# Patient Record
Sex: Female | Born: 1985 | Race: Black or African American | Hispanic: No | Marital: Single | Smoking: Never smoker
Health system: Southern US, Community
[De-identification: ages and names within clinical notes are randomized; demographics above are authoritative.]

## PROBLEM LIST (undated history)

## (undated) ENCOUNTER — Inpatient Hospital Stay (HOSPITAL_COMMUNITY): Payer: Self-pay

## (undated) DIAGNOSIS — IMO0002 Reserved for concepts with insufficient information to code with codable children: Secondary | ICD-10-CM

## (undated) DIAGNOSIS — T4145XA Adverse effect of unspecified anesthetic, initial encounter: Secondary | ICD-10-CM

## (undated) DIAGNOSIS — D649 Anemia, unspecified: Secondary | ICD-10-CM

## (undated) DIAGNOSIS — L0291 Cutaneous abscess, unspecified: Secondary | ICD-10-CM

## (undated) DIAGNOSIS — B999 Unspecified infectious disease: Secondary | ICD-10-CM

## (undated) HISTORY — DX: Anemia, unspecified: D64.9

## (undated) HISTORY — DX: Adverse effect of unspecified anesthetic, initial encounter: T41.45XA

## (undated) HISTORY — DX: Unspecified infectious disease: B99.9

## (undated) HISTORY — DX: Reserved for concepts with insufficient information to code with codable children: IMO0002

## (undated) SURGERY — Surgical Case
Anesthesia: *Unknown

---
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
## 2007-01-21 ENCOUNTER — Ambulatory Visit: Payer: Self-pay | Admitting: Family Medicine

## 2007-01-28 ENCOUNTER — Encounter (INDEPENDENT_AMBULATORY_CARE_PROVIDER_SITE_OTHER): Payer: Self-pay | Admitting: *Deleted

## 2007-01-28 ENCOUNTER — Ambulatory Visit: Payer: Self-pay | Admitting: Family Medicine

## 2007-01-28 LAB — CONVERTED CEMR LAB
Glucose, Urine, Semiquant: NEGATIVE
Protein, U semiquant: NEGATIVE

## 2007-02-03 ENCOUNTER — Ambulatory Visit: Payer: Self-pay | Admitting: Family Medicine

## 2007-02-03 ENCOUNTER — Encounter (INDEPENDENT_AMBULATORY_CARE_PROVIDER_SITE_OTHER): Payer: Self-pay | Admitting: *Deleted

## 2007-02-11 ENCOUNTER — Ambulatory Visit: Payer: Self-pay | Admitting: Family Medicine

## 2007-02-11 LAB — CONVERTED CEMR LAB: Glucose, Urine, Semiquant: NEGATIVE

## 2007-03-04 ENCOUNTER — Ambulatory Visit (HOSPITAL_COMMUNITY): Admission: RE | Admit: 2007-03-04 | Discharge: 2007-03-04 | Payer: Self-pay | Admitting: *Deleted

## 2007-03-04 ENCOUNTER — Encounter (INDEPENDENT_AMBULATORY_CARE_PROVIDER_SITE_OTHER): Payer: Self-pay | Admitting: *Deleted

## 2007-04-03 DIAGNOSIS — T8859XA Other complications of anesthesia, initial encounter: Secondary | ICD-10-CM

## 2007-04-03 DIAGNOSIS — R87619 Unspecified abnormal cytological findings in specimens from cervix uteri: Secondary | ICD-10-CM

## 2007-04-03 DIAGNOSIS — IMO0002 Reserved for concepts with insufficient information to code with codable children: Secondary | ICD-10-CM

## 2007-04-03 HISTORY — DX: Reserved for concepts with insufficient information to code with codable children: IMO0002

## 2007-04-03 HISTORY — DX: Unspecified abnormal cytological findings in specimens from cervix uteri: R87.619

## 2007-04-03 HISTORY — DX: Other complications of anesthesia, initial encounter: T88.59XA

## 2007-04-16 ENCOUNTER — Inpatient Hospital Stay (HOSPITAL_COMMUNITY): Admission: AD | Admit: 2007-04-16 | Discharge: 2007-04-16 | Payer: Self-pay | Admitting: Obstetrics and Gynecology

## 2007-04-20 ENCOUNTER — Inpatient Hospital Stay (HOSPITAL_COMMUNITY): Admission: AD | Admit: 2007-04-20 | Discharge: 2007-04-20 | Payer: Self-pay | Admitting: Obstetrics and Gynecology

## 2007-05-06 ENCOUNTER — Inpatient Hospital Stay (HOSPITAL_COMMUNITY): Admission: AD | Admit: 2007-05-06 | Discharge: 2007-05-06 | Payer: Self-pay | Admitting: Obstetrics and Gynecology

## 2007-05-24 ENCOUNTER — Inpatient Hospital Stay (HOSPITAL_COMMUNITY): Admission: AD | Admit: 2007-05-24 | Discharge: 2007-05-25 | Payer: Self-pay | Admitting: Obstetrics and Gynecology

## 2007-06-05 ENCOUNTER — Inpatient Hospital Stay (HOSPITAL_COMMUNITY): Admission: AD | Admit: 2007-06-05 | Discharge: 2007-06-05 | Payer: Self-pay | Admitting: Obstetrics and Gynecology

## 2007-06-21 ENCOUNTER — Inpatient Hospital Stay (HOSPITAL_COMMUNITY): Admission: AD | Admit: 2007-06-21 | Discharge: 2007-06-21 | Payer: Self-pay | Admitting: Obstetrics and Gynecology

## 2007-06-25 ENCOUNTER — Inpatient Hospital Stay (HOSPITAL_COMMUNITY): Admission: AD | Admit: 2007-06-25 | Discharge: 2007-06-25 | Payer: Self-pay | Admitting: Obstetrics and Gynecology

## 2007-06-26 ENCOUNTER — Inpatient Hospital Stay (HOSPITAL_COMMUNITY): Admission: AD | Admit: 2007-06-26 | Discharge: 2007-06-26 | Payer: Self-pay | Admitting: Obstetrics and Gynecology

## 2007-07-03 ENCOUNTER — Inpatient Hospital Stay (HOSPITAL_COMMUNITY): Admission: AD | Admit: 2007-07-03 | Discharge: 2007-07-04 | Payer: Self-pay | Admitting: Obstetrics and Gynecology

## 2007-07-15 ENCOUNTER — Inpatient Hospital Stay (HOSPITAL_COMMUNITY): Admission: AD | Admit: 2007-07-15 | Discharge: 2007-07-18 | Payer: Self-pay | Admitting: Obstetrics and Gynecology

## 2008-01-10 ENCOUNTER — Emergency Department (HOSPITAL_COMMUNITY): Admission: EM | Admit: 2008-01-10 | Discharge: 2008-01-11 | Payer: Self-pay | Admitting: Emergency Medicine

## 2008-12-08 ENCOUNTER — Encounter: Payer: Self-pay | Admitting: Family Medicine

## 2008-12-08 ENCOUNTER — Ambulatory Visit: Payer: Self-pay | Admitting: Family Medicine

## 2008-12-08 DIAGNOSIS — N898 Other specified noninflammatory disorders of vagina: Secondary | ICD-10-CM | POA: Insufficient documentation

## 2008-12-08 DIAGNOSIS — D649 Anemia, unspecified: Secondary | ICD-10-CM | POA: Insufficient documentation

## 2008-12-08 DIAGNOSIS — N912 Amenorrhea, unspecified: Secondary | ICD-10-CM | POA: Insufficient documentation

## 2008-12-08 LAB — CONVERTED CEMR LAB
ALT: 12 units/L (ref 0–35)
AST: 13 units/L (ref 0–37)
Alkaline Phosphatase: 69 units/L (ref 39–117)
Beta hcg, urine, semiquantitative: NEGATIVE
Calcium: 8.9 mg/dL (ref 8.4–10.5)
Chlamydia, DNA Probe: NEGATIVE
Chloride: 108 meq/L (ref 96–112)
Creatinine, Ser: 0.94 mg/dL (ref 0.40–1.20)
GC Probe Amp, Genital: NEGATIVE
Total Protein: 7.3 g/dL (ref 6.0–8.3)

## 2008-12-15 ENCOUNTER — Telehealth (INDEPENDENT_AMBULATORY_CARE_PROVIDER_SITE_OTHER): Payer: Self-pay | Admitting: *Deleted

## 2008-12-21 ENCOUNTER — Ambulatory Visit: Payer: Self-pay | Admitting: Family Medicine

## 2008-12-21 DIAGNOSIS — R87613 High grade squamous intraepithelial lesion on cytologic smear of cervix (HGSIL): Secondary | ICD-10-CM

## 2008-12-22 ENCOUNTER — Encounter: Payer: Self-pay | Admitting: Family Medicine

## 2009-01-02 ENCOUNTER — Telehealth (INDEPENDENT_AMBULATORY_CARE_PROVIDER_SITE_OTHER): Payer: Self-pay | Admitting: *Deleted

## 2009-06-24 ENCOUNTER — Encounter: Payer: Self-pay | Admitting: Family Medicine

## 2010-05-02 NOTE — Letter (Signed)
Summary: Unable to contact, needs follow-up  [facility]  [address]   [place], [place] [address]   Phone: [phone]  Fax: [phone]    06/24/2009  Irish Donaghy [address] [place], [place]  [address]  Dear Ms. Kirk,  You did not keep your appointment for gynecology referral that we made for you.  I am concerned as your colposcopy taht her had performed in our office needs prompt follow-up.  I have attempted to call you and have not been able to get a correct phone number.  Please call our office to update your contact information.  We need to reschedule you for further evaluation promptly.   Sincerely,   Delbert Harness MD  Appended Document: Unable to contact, needs follow-up mailed certified.  Appended Document: Unable to contact, needs follow-up Please send the letter from [facility] as a certifed letter to her most current addres thanks LC  Appended Document: Unable to contact, needs follow-up cert mail returned- unclaimed

## 2010-07-31 ENCOUNTER — Encounter: Payer: Self-pay | Admitting: Family Medicine

## 2010-08-15 NOTE — Op Note (Signed)
NAME:  Melanie Neal, Melanie Neal             ACCOUNT NO.:  [id number]   MEDICAL RECORD NO.:  [id number]          PATIENT TYPE:  INP   LOCATION:  9169                          FACILITY:  [facility]   PHYSICIAN:  Charles A. Delcambre, MDDATE OF BIRTH:  1985-12-05   DATE OF PROCEDURE:  07/15/2007  DATE OF DISCHARGE:                               OPERATIVE REPORT   PREOPERATIVE DIAGNOSIS:  1. Intrauterine pregnancy at 37 weeks and 3 days.  2. Fetal bradycardia.  3. Resultant fetal intolerance of labor.   POSTOPERATIVE DIAGNOSIS:  1. Intrauterine pregnancy at 37 weeks and 3 days.  2. Fetal bradycardia.  3. Resultant fetal intolerance of labor.   PROCEDURE:  Primary low transverse cesarean section.   SURGEON:  Charles A. Delcambre, MD   ASSISTANT:  None.   COMPLICATIONS:  None.   ESTIMATED BLOOD LOSS:  700 mL.  Urine output 275 mL, clear.   INSTRUMENT SPONGE NEEDLE COUNT:  Correct x2.   PATHOLOGY:  Placenta to pathology.   FINDINGS:  Vigorous female, Apgars eight and nine, six pounds 15 ounces.   DESCRIPTION OF PROCEDURE:  The patient was taken to the operating room,  placed supine position, dosing of the epidural was done.  She was  adequately dosed and sterile prep and drape was undertaken.  Pfannenstiel incision was made with a knife, carried down to fascia.  The fascia was incised with a knife and Mayo scissors.  Sharp dissection  was used to release the rectus sheath superiorly and inferiorly.  The  rectus muscles were sharply dissected in the midline.  Peritoneum was  entered with a hemostat.  Traction was used to extend this incision.  Alexis retractor was then placed, hand was swept and there was no bowel  or omentum caught under the retractor.  This retractor was then rolled  down for adequate exposure.  The vesicouterine peritoneum was incised  with Metzenbaum scissors and blunt dissection was used to develop the  bladder flap.  Lower uterine segment transverse incision to  amniotomy  was noted with fetal head ballotable.  Entry into the amniotic cavity  was done without injury to the infant.  Traction was placed.  The  uterine incision was extended by cephalad and caudal traction.  Hand was  inserted.  Occiput was lifted to the uterine and guided to the uterine  incision site.  Fundal pressure by the operator's assistant was used to  deliver the vertex. Bulb suction was done.  The remainder of the infant  was delivered with further fundal pressure without difficulty.  Cord was  clamped and infant was shown to the parents and handed off to the  neonatologist in attendance.  Placenta was made manually expressed after  cord blood was taken for routine testing.  The placenta was sent to  labor and delivery. Internal surface of the uterus was wiped with a dry  lap and uterine incision was then closed in two layers with #1 chromic  running locking, first layer running nonlocking second layer.  Irrigation was carried out.  Hemostasis was excellent.  Fascia was  closed with #1 Vicryl running nonlocking  suture.  Irrigation was carried  out.  Subcutaneous layer minor electrocautery achieved good hemostasis.  Sterile skin clips were used to close the skin.  Sterile dressing was  applied.  The patient was taken to recovery with physician in attendance  having tolerated the procedure well.      Charles A. Sydnee Cabal, MD  Electronically Signed     CAD/MEDQ  D:  07/15/2007  T:  07/15/2007  Job:  [id number]

## 2010-08-18 NOTE — Discharge Summary (Signed)
NAME:  Melanie Neal, Melanie Neal             ACCOUNT NO.:  [id number]   MEDICAL RECORD NO.:  [id number]          PATIENT TYPE:  INP   LOCATION:  9101                          FACILITY:  [facility]   PHYSICIAN:  Charles A. Delcambre, MDDATE OF BIRTH:  04-May-1985   DATE OF ADMISSION:  07/15/2007  DATE OF DISCHARGE:  07/18/2007                               DISCHARGE SUMMARY   PRIMARY DISCHARGE DIAGNOSES:  1. Intrauterine pregnancy 37 weeks and 3 days.  2. Fetal intolerance with labor.   PROCEDURE:  Primary low transverse cesarean section.   OPERATIVE FINDINGS:  Normal tubes and ovaries, vigorous female, Apgars 8  and 9, 3170 g, and 49.5 cm in length.   LABORATORY:  Postoperative hematocrit 30.5 and hemoglobin 10.4.   HISTORY AND PHYSICAL:  As written in the chart.   HOSPITAL COURSE:  The patient was noted to cervix from 1 cm to 4 cm and  was admitted therefore active labor.  She progressed on to 580 -3 and  began having intolerance fetal heart rate, nonreassuring.  She was  therefore taken for cesarean section.  The cesarean section was  undertaken, done without difficulty or complications.  Live born infant  delivered as noted above.  On postop day #1, she had no trouble voiding.  Pain was controlled with p.o. medications after Duramorph.  She was  stable and tolerating a regular diet and was therefore discharged home  on postop day #3.  Depo-Provera 150 mg IM was given prior to discharge.      Charles A. Sydnee Cabal, MD  Electronically Signed     CAD/MEDQ  D:  08/05/2007  T:  08/06/2007  Job:  [id number]

## 2010-12-12 ENCOUNTER — Ambulatory Visit: Payer: Self-pay | Admitting: Family Medicine

## 2010-12-18 ENCOUNTER — Ambulatory Visit (INDEPENDENT_AMBULATORY_CARE_PROVIDER_SITE_OTHER): Payer: Self-pay | Admitting: Family Medicine

## 2010-12-18 ENCOUNTER — Encounter: Payer: Self-pay | Admitting: Family Medicine

## 2010-12-18 VITALS — BP 118/81 | HR 76 | Wt 233.0 lb

## 2010-12-18 DIAGNOSIS — R87613 High grade squamous intraepithelial lesion on cytologic smear of cervix (HGSIL): Secondary | ICD-10-CM

## 2010-12-18 DIAGNOSIS — N926 Irregular menstruation, unspecified: Secondary | ICD-10-CM

## 2010-12-18 LAB — POCT URINE PREGNANCY: Preg Test, Ur: NEGATIVE

## 2010-12-18 NOTE — Patient Instructions (Signed)
You had pre-cancerous cells on your cervix in 2010.  It is VERY important to get evaluated by a gynecologist.  Let us know if you don't hear about an appt in the next week Very important to take prenatal vitamins daily i recommend not getting pregnant- use condoms- until you have your appointment with gynecology. Work on healthy foods and daily exercise to help get you to the healthiest weight for you.  At your age , it can take 6 months to a year of trying before getting pregnant and this is normal.

## 2010-12-19 NOTE — Assessment & Plan Note (Addendum)
Patient declines pap today, states she is in a hurry and will reschedule.  I asked her to make follow-up for pap on her way out.  Discussed case with Dr Debroah Loop, OBGYN, given her young age at diagnosis, he would recommend a pap now, and repeat in 6 months.  If normal, can resume annual screening.  If abnormal, will send for colposcopy.  I advised against pregnancy until this is resolved.

## 2010-12-19 NOTE — Progress Notes (Signed)
  Subjective:    Patient ID: Melanie Neal, female    DOB: 1985-05-21, 25 y.o.   MRN: [id number]  HPI Here for a pregnancy test.  Patient states she has been trying to get pregnant with her boyfriend for 1-2 months.  She last had a lighter than usual period 2 days ago.  Otherwise has had normal monthly periods.  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 She states she moved addresses and changed phone numbers and did not follow-up her test results.   Is taking PNV.  I spent >25 minutes with the patient, over 50% of which was spent in counseling and/or coordination of care as noted.   Review of Systemssee HPI     Objective:   Physical Exam GEN: NAD, does not appear distressed with results of possible cervical cancer.       Assessment & Plan:

## 2010-12-21 LAB — FETAL FIBRONECTIN: Fetal Fibronectin: NEGATIVE

## 2010-12-21 LAB — URINALYSIS, ROUTINE W REFLEX MICROSCOPIC
Bilirubin Urine: NEGATIVE
Bilirubin Urine: NEGATIVE
Glucose, UA: NEGATIVE
Hgb urine dipstick: NEGATIVE
Ketones, ur: NEGATIVE
Ketones, ur: NEGATIVE
Nitrite: NEGATIVE
Specific Gravity, Urine: 1.01
Urobilinogen, UA: 0.2
pH: 6.5

## 2010-12-21 LAB — URINE MICROSCOPIC-ADD ON

## 2010-12-22 LAB — URINALYSIS, ROUTINE W REFLEX MICROSCOPIC
Glucose, UA: NEGATIVE
Hgb urine dipstick: NEGATIVE
Urobilinogen, UA: 1
pH: 7

## 2010-12-25 LAB — URINALYSIS, ROUTINE W REFLEX MICROSCOPIC
Bilirubin Urine: NEGATIVE
Hgb urine dipstick: NEGATIVE
Ketones, ur: NEGATIVE
Specific Gravity, Urine: 1.025
pH: 5.5

## 2010-12-26 LAB — CBC
HCT: 33.1 — ABNORMAL LOW
HCT: 30.5 — ABNORMAL LOW
Hemoglobin: 10.4 — ABNORMAL LOW
MCHC: 34.2
MCV: 83.3
RBC: 3.98
RDW: 14.7
RDW: 14.4
WBC: 13.3 — ABNORMAL HIGH

## 2010-12-26 LAB — URINALYSIS, ROUTINE W REFLEX MICROSCOPIC
Bilirubin Urine: NEGATIVE
Glucose, UA: NEGATIVE
Ketones, ur: 15 — AB
Nitrite: NEGATIVE
Protein, ur: NEGATIVE

## 2010-12-26 LAB — URINE MICROSCOPIC-ADD ON: RBC / HPF: NONE SEEN

## 2011-01-25 ENCOUNTER — Encounter: Payer: Self-pay | Admitting: Family Medicine

## 2011-02-12 ENCOUNTER — Encounter: Payer: Self-pay | Admitting: Family Medicine

## 2011-09-24 ENCOUNTER — Inpatient Hospital Stay (HOSPITAL_COMMUNITY): Payer: Medicaid Other

## 2011-09-24 ENCOUNTER — Encounter (HOSPITAL_COMMUNITY): Payer: Self-pay | Admitting: *Deleted

## 2011-09-24 ENCOUNTER — Inpatient Hospital Stay (HOSPITAL_COMMUNITY)
Admission: AD | Admit: 2011-09-24 | Discharge: 2011-09-24 | Disposition: A | Discharge status: Home / Self Care | Payer: Medicaid Other | Source: Ambulatory Visit | Attending: Obstetrics and Gynecology | Admitting: Obstetrics and Gynecology

## 2011-09-24 DIAGNOSIS — Z363 Encounter for antenatal screening for malformations: Secondary | ICD-10-CM

## 2011-09-24 DIAGNOSIS — R109 Unspecified abdominal pain: Secondary | ICD-10-CM | POA: Insufficient documentation

## 2011-09-24 DIAGNOSIS — O99891 Other specified diseases and conditions complicating pregnancy: Secondary | ICD-10-CM | POA: Insufficient documentation

## 2011-09-24 DIAGNOSIS — Z1389 Encounter for screening for other disorder: Secondary | ICD-10-CM

## 2011-09-24 DIAGNOSIS — Z3490 Encounter for supervision of normal pregnancy, unspecified, unspecified trimester: Secondary | ICD-10-CM

## 2011-09-24 LAB — CBC
MCH: 28.1 pg (ref 26.0–34.0)
Platelets: 249 10*3/uL (ref 150–400)
RBC: 4.37 MIL/uL (ref 3.87–5.11)

## 2011-09-24 LAB — URINALYSIS, ROUTINE W REFLEX MICROSCOPIC
Bilirubin Urine: NEGATIVE
Nitrite: NEGATIVE
Specific Gravity, Urine: 1.02 (ref 1.005–1.030)
Urobilinogen, UA: 0.2 mg/dL (ref 0.0–1.0)

## 2011-09-24 LAB — HCG, QUANTITATIVE, PREGNANCY: hCG, Beta Chain, Quant, S: 46496 m[IU]/mL — ABNORMAL HIGH (ref <5)

## 2011-09-24 LAB — URINE MICROSCOPIC-ADD ON

## 2011-09-24 LAB — POCT PREGNANCY, URINE: Preg Test, Ur: POSITIVE — AB

## 2011-09-24 MED ORDER — PROMETHAZINE HCL 12.5 MG PO TABS: 12.5000 mg | ORAL_TABLET | Freq: Four times a day (QID) | ORAL | Status: DC | PRN

## 2011-09-24 MED ORDER — LABETALOL HCL 100 MG PO TABS: 300.0000 mg | ORAL_TABLET | Freq: Once | ORAL | Status: DC

## 2011-09-24 NOTE — Discharge Instructions (Signed)
Pregnancy - First Trimester  During sexual intercourse, millions of sperm go into the vagina. Only 1 sperm will penetrate and fertilize the female egg while it is in the Fallopian tube. One week later, the fertilized egg implants into the wall of the uterus. An embryo begins to develop into a baby. At 6 to 8 weeks, the eyes and face are formed and the heartbeat can be seen on ultrasound. At the end of 12 weeks (first trimester), all the baby's organs are formed. Now that you are pregnant, you will want to do everything you can to have a healthy baby. Two of the most important things are to get good prenatal care and follow your caregiver's instructions. Prenatal care is all the medical care you receive before the baby's birth. It is given to prevent, find, and treat problems during the pregnancy and childbirth.  PRENATAL EXAMS   During prenatal visits, your weight, blood pressure and urine are checked. This is done to make sure you are healthy and progressing normally during the pregnancy.   A pregnant woman should gain 25 to 35 pounds during the pregnancy. However, if you are over weight or underweight, your caregiver will advise you regarding your weight.   Your caregiver will ask and answer questions for you.   Blood work, cervical cultures, other necessary tests and a Pap test are done during your prenatal exams. These tests are done to check on your health and the probable health of your baby. Tests are strongly recommended and done for HIV with your permission. This is the virus that causes AIDS. These tests are done because medications can be given to help prevent your baby from being born with this infection should you have been infected without knowing it. Blood work is also used to find out your blood type, previous infections and follow your blood levels (hemoglobin).   Low hemoglobin (anemia) is common during pregnancy. Iron and vitamins are given to help prevent this. Later in the pregnancy, blood  tests for diabetes will be done along with any other tests if any problems develop. You may need tests to make sure you and the baby are doing well.   You may need other tests to make sure you and the baby are doing well.  CHANGES DURING THE FIRST TRIMESTER (THE FIRST 3 MONTHS OF PREGNANCY)  Your body goes through many changes during pregnancy. They vary from person to person. Talk to your caregiver about changes you notice and are concerned about. Changes can include:   Your menstrual period stops.   The egg and sperm carry the genes that determine what you look like. Genes from you and your partner are forming a baby. The female genes determine whether the baby is a boy or a girl.   Your body increases in girth and you may feel bloated.   Feeling sick to your stomach (nauseous) and throwing up (vomiting). If the vomiting is uncontrollable, call your caregiver.   Your breasts will begin to enlarge and become tender.   Your nipples may stick out more and become darker.   The need to urinate more. Painful urination may mean you have a bladder infection.   Tiring easily.   Loss of appetite.   Cravings for certain kinds of food.   At first, you may gain or lose a couple of pounds.   You may have changes in your emotions from day to day (excited to be pregnant or concerned something may go wrong with   the pregnancy and baby).   You may have more vivid and strange dreams.  HOME CARE INSTRUCTIONS    It is very important to avoid all smoking, alcohol and un-prescribed drugs during your pregnancy. These affect the formation and growth of the baby. Avoid chemicals while pregnant to ensure the delivery of a healthy infant.   Start your prenatal visits by the 12th week of pregnancy. They are usually scheduled monthly at first, then more often in the last 2 months before delivery. Keep your caregiver's appointments. Follow your caregiver's instructions regarding medication use, blood and lab tests, exercise, and  diet.   During pregnancy, you are providing food for you and your baby. Eat regular, well-balanced meals. Choose foods such as meat, fish, milk and other low fat dairy products, vegetables, fruits, and whole-grain breads and cereals. Your caregiver will tell you of the ideal weight gain.   You can help morning sickness by keeping soda crackers at the bedside. Eat a couple before arising in the morning. You may want to use the crackers without salt on them.   Eating 4 to 5 small meals rather than 3 large meals a day also may help the nausea and vomiting.   Drinking liquids between meals instead of during meals also seems to help nausea and vomiting.   A physical sexual relationship may be continued throughout pregnancy if there are no other problems. Problems may be early (premature) leaking of amniotic fluid from the membranes, vaginal bleeding, or belly (abdominal) pain.   Exercise regularly if there are no restrictions. Check with your caregiver or physical therapist if you are unsure of the safety of some of your exercises. Greater weight gain will occur in the last 2 trimesters of pregnancy. Exercising will help:   Control your weight.   Keep you in shape.   Prepare you for labor and delivery.   Help you lose your pregnancy weight after you deliver your baby.   Wear a good support or jogging bra for breast tenderness during pregnancy. This may help if worn during sleep too.   Ask when prenatal classes are available. Begin classes when they are offered.   Do not use hot tubs, steam rooms or saunas.   Wear your seat belt when driving. This protects you and your baby if you are in an accident.   Avoid raw meat, uncooked cheese, cat litter boxes and soil used by cats throughout the pregnancy. These carry germs that can cause birth defects in the baby.   The first trimester is a good time to visit your dentist for your dental health. Getting your teeth cleaned is OK. Use a softer toothbrush and brush  gently during pregnancy.   Ask for help if you have financial, counseling or nutritional needs during pregnancy. Your caregiver will be able to offer counseling for these needs as well as refer you for other special needs.   Do not take any medications or herbs unless told by your caregiver.   Inform your caregiver if there is any mental or physical domestic violence.   Make a list of emergency phone numbers of family, friends, hospital, and police and fire departments.   Write down your questions. Take them to your prenatal visit.   Do not douche.   Do not cross your legs.   If you have to stand for long periods of time, rotate you feet or take small steps in a circle.   You may have more vaginal secretions that may   require a sanitary pad. Do not use tampons or scented sanitary pads.  MEDICATIONS AND DRUG USE IN PREGNANCY   Take prenatal vitamins as directed. The vitamin should contain 1 milligram of folic acid. Keep all vitamins out of reach of children. Only a couple vitamins or tablets containing iron may be fatal to a baby or young child when ingested.   Avoid use of all medications, including herbs, over-the-counter medications, not prescribed or suggested by your caregiver. Only take over-the-counter or prescription medicines for pain, discomfort, or fever as directed by your caregiver. Do not use aspirin, ibuprofen, or naproxen unless directed by your caregiver.   Let your caregiver also know about herbs you may be using.   Alcohol is related to a number of birth defects. This includes fetal alcohol syndrome. All alcohol, in any form, should be avoided completely. Smoking will cause low birth rate and premature babies.   Street or illegal drugs are very harmful to the baby. They are absolutely forbidden. A baby born to an addicted mother will be addicted at birth. The baby will go through the same withdrawal an adult does.   Let your caregiver know about any medications that you have to take  and for what reason you take them.  MISCARRIAGE IS COMMON DURING PREGNANCY  A miscarriage does not mean you did something wrong. It is not a reason to worry about getting pregnant again. Your caregiver will help you with questions you may have. If you have a miscarriage, you may need minor surgery.  SEEK MEDICAL CARE IF:   You have any concerns or worries during your pregnancy. It is better to call with your questions if you feel they cannot wait, rather than worry about them.  SEEK IMMEDIATE MEDICAL CARE IF:    An unexplained oral temperature above 102 F (38.9 C) develops, or as your caregiver suggests.   You have leaking of fluid from the vagina (birth canal). If leaking membranes are suspected, take your temperature and inform your caregiver of this when you call.   There is vaginal spotting or bleeding. Notify your caregiver of the amount and how many pads are used.   You develop a bad smelling vaginal discharge with a change in the color.   You continue to feel sick to your stomach (nauseated) and have no relief from remedies suggested. You vomit blood or coffee ground-like materials.   You lose more than 2 pounds of weight in 1 week.   You gain more than 2 pounds of weight in 1 week and you notice swelling of your face, hands, feet, or legs.   You gain 5 pounds or more in 1 week (even if you do not have swelling of your hands, face, legs, or feet).   You get exposed to German measles and have never had them.   You are exposed to fifth disease or chickenpox.   You develop belly (abdominal) pain. Round ligament discomfort is a common non-cancerous (benign) cause of abdominal pain in pregnancy. Your caregiver still must evaluate this.   You develop headache, fever, diarrhea, pain with urination, or shortness of breath.   You fall or are in a car accident or have any kind of trauma.   There is mental or physical violence in your home.  Document Released: 03/13/2001 Document Revised: 03/08/2011  Document Reviewed: 09/14/2008  [employer] Patient Information 2012 [employer].

## 2011-09-24 NOTE — MAU Note (Addendum)
Pt states having abd pain throughout upper and lower abdomen. Has not started menses this month, is 2 weeks late. UPT in MAU positive. Denies abnormal vaginal d/c changes.

## 2011-09-24 NOTE — MAU Provider Note (Signed)
Melanie Dunk Pruitt25 y.o.G2P1001 @[redacted]w[redacted]d  by LMP Chief Complaint  Patient presents with  . Abdominal Pain     None     SUBJECTIVE  HPI: Pt presents with late menses by 2 weeks and menstrual-like cramping.  She denies LOF, vaginal bleeding, vaginal itching/burning, urinary symptoms, h/a, dizziness, n/v, or fever/chills.  She also denies exposure to STIs. If pregnancy is confirmed, she plans to begin prenatal care at [facility] physicians as soon as Medicaid application is completed.  History reviewed. No pertinent past medical history. History reviewed. No pertinent past surgical history. History   Social History  . Marital Status: Single    Spouse Name: N/A    Number of Children: N/A  . Years of Education: N/A   Occupational History  . Not on file.   Social History Main Topics  . Smoking status: Never Smoker   . Smokeless tobacco: Not on file  . Alcohol Use: Not on file  . Drug Use: Not on file  . Sexually Active: Not on file   Other Topics Concern  . Not on file   Social History Narrative  . No narrative on file   No current facility-administered medications on file prior to encounter.   No current outpatient prescriptions on file prior to encounter.   No Known Allergies  ROS: Pertinent items in HPI  OBJECTIVE Blood pressure 127/62, pulse 82, temperature 98.5 F (36.9 C), temperature source Oral, resp. rate 18, height 5\' 4"  (1.626 m), weight 105.235 kg (232 lb), last menstrual period 08/11/2011.  GENERAL: Well-developed, well-nourished female in no acute distress.  HEENT: Normocephalic, good dentition HEART: normal rate RESP: normal effort ABDOMEN: Soft, nontender EXTREMITIES: Nontender, no edema NEURO: Alert and oriented SPECULUM EXAM: Deferred   LAB RESULTS  Results for orders placed during the hospital encounter of 09/24/11 (from the past 24 hour(s))  POCT PREGNANCY, URINE     Status: Abnormal   Collection Time   09/24/11 11:28 AM      Component Value  Range   Preg Test, Ur POSITIVE (*) NEGATIVE  HCG, QUANTITATIVE, PREGNANCY     Status: Abnormal   Collection Time   09/24/11 11:45 AM      Component Value Range   hCG, Beta Chain, Sharene Butters, [place] [address] (*) <5 mIU/mL  CBC     Status: Normal   Collection Time   09/24/11 11:46 AM      Component Value Range   WBC 8.4  4.0 - 10.5 K/uL   RBC 4.37  3.87 - 5.11 MIL/uL   Hemoglobin 12.3  12.0 - 15.0 g/dL   HCT 60.4  54.0 - 98.1 %   MCV 85.6  78.0 - 100.0 fL   MCH 28.1  26.0 - 34.0 pg   MCHC 32.9  30.0 - 36.0 g/dL   RDW 19.1  47.8 - 29.5 %   Platelets 249  150 - 400 K/uL  URINALYSIS, ROUTINE W REFLEX MICROSCOPIC     Status: Abnormal   Collection Time   09/24/11 11:47 AM      Component Value Range   Color, Urine YELLOW  YELLOW   APPearance CLEAR  CLEAR   Specific Gravity, Urine 1.020  1.005 - 1.030   pH 6.5  5.0 - 8.0   Glucose, UA NEGATIVE  NEGATIVE mg/dL   Hgb urine dipstick TRACE (*) NEGATIVE   Bilirubin Urine NEGATIVE  NEGATIVE   Ketones, ur NEGATIVE  NEGATIVE mg/dL   Protein, ur NEGATIVE  NEGATIVE mg/dL   Urobilinogen, UA 0.2  0.0 - 1.0 mg/dL   Nitrite NEGATIVE  NEGATIVE   Leukocytes, UA SMALL (*) NEGATIVE  URINE MICROSCOPIC-ADD ON     Status: Normal   Collection Time   09/24/11 11:47 AM      Component Value Range   Squamous Epithelial / LPF RARE  RARE   WBC, UA 0-2  <3 WBC/hpf   Bacteria, UA RARE  RARE    IMAGING IUP [redacted]w[redacted]d with cardiac activity  ASSESSMENT IUP [redacted]w[redacted]d  PLAN Pregnancy verification letter given List of OB providers given as pt may want to begin care at a new practice Urine sent for culture D/C home Return to MAU as needed  LEFTWICH-KIRBY, Yecenia Dalgleish 09/24/2011 1:30 PM

## 2011-09-25 LAB — URINE CULTURE
Colony Count: 75000
Culture  Setup Time: 201306242113

## 2011-09-25 NOTE — MAU Provider Note (Signed)
Agree with above note.  Melanie Neal 09/25/2011 7:38 AM

## 2011-09-26 ENCOUNTER — Other Ambulatory Visit (HOSPITAL_COMMUNITY): Payer: Self-pay | Admitting: Nurse Practitioner

## 2011-09-26 MED ORDER — AMOXICILLIN 500 MG PO CAPS: 500.0000 mg | ORAL_CAPSULE | Freq: Three times a day (TID) | ORAL | Status: AC

## 2011-09-28 ENCOUNTER — Telehealth: Payer: Self-pay | Admitting: Obstetrics and Gynecology

## 2011-09-28 ENCOUNTER — Encounter: Payer: Self-pay | Admitting: Obstetrics and Gynecology

## 2011-09-28 DIAGNOSIS — A491 Streptococcal infection, unspecified site: Secondary | ICD-10-CM | POA: Insufficient documentation

## 2011-09-28 NOTE — Telephone Encounter (Signed)
Did not call pt: already being treated with Amox.

## 2011-11-16 ENCOUNTER — Ambulatory Visit (INDEPENDENT_AMBULATORY_CARE_PROVIDER_SITE_OTHER): Payer: Medicaid Other | Admitting: Obstetrics and Gynecology

## 2011-11-16 DIAGNOSIS — Z331 Pregnant state, incidental: Secondary | ICD-10-CM

## 2011-11-16 LAB — POCT URINALYSIS DIPSTICK
Nitrite, UA: NEGATIVE
Protein, UA: NEGATIVE
pH, UA: 5

## 2011-11-17 LAB — PRENATAL PANEL VII
Hepatitis B Surface Ag: NEGATIVE
Lymphocytes Relative: 15 % (ref 12–46)
Lymphs Abs: 1.3 10*3/uL (ref 0.7–4.0)
MCV: 83.4 fL (ref 78.0–100.0)
Neutrophils Relative %: 81 % — ABNORMAL HIGH (ref 43–77)
Platelets: 247 10*3/uL (ref 150–400)
RBC: 3.98 MIL/uL (ref 3.87–5.11)
Rubella: 22.1 IU/mL — ABNORMAL HIGH
WBC: 8.4 10*3/uL (ref 4.0–10.5)

## 2011-11-17 LAB — CULTURE, OB URINE

## 2011-11-17 LAB — OB RESULTS CONSOLE HIV ANTIBODY (ROUTINE TESTING): HIV: NONREACTIVE

## 2011-11-20 ENCOUNTER — Other Ambulatory Visit: Payer: Self-pay | Admitting: Emergency Medicine

## 2011-11-20 ENCOUNTER — Ambulatory Visit (INDEPENDENT_AMBULATORY_CARE_PROVIDER_SITE_OTHER): Payer: Medicaid Other | Admitting: Obstetrics and Gynecology

## 2011-11-20 ENCOUNTER — Encounter: Payer: Self-pay | Admitting: Obstetrics and Gynecology

## 2011-11-20 DIAGNOSIS — Z1379 Encounter for other screening for genetic and chromosomal anomalies: Secondary | ICD-10-CM

## 2011-11-20 DIAGNOSIS — Z9889 Other specified postprocedural states: Secondary | ICD-10-CM

## 2011-11-20 DIAGNOSIS — Z98891 History of uterine scar from previous surgery: Secondary | ICD-10-CM | POA: Insufficient documentation

## 2011-11-20 DIAGNOSIS — Z3689 Encounter for other specified antenatal screening: Secondary | ICD-10-CM

## 2011-11-20 DIAGNOSIS — Z124 Encounter for screening for malignant neoplasm of cervix: Secondary | ICD-10-CM

## 2011-11-20 DIAGNOSIS — Z331 Pregnant state, incidental: Secondary | ICD-10-CM

## 2011-11-20 LAB — POCT WET PREP (WET MOUNT): Clue Cells Wet Prep Whiff POC: NEGATIVE

## 2011-11-20 LAB — HEMOGLOBINOPATHY EVALUATION
Hemoglobin Other: 1.3 % — ABNORMAL HIGH
Hgb S Quant: 0 %

## 2011-11-20 NOTE — Addendum Note (Signed)
Addended by: Janeece Agee on: 11/20/2011 05:06 PM   Modules accepted: Orders

## 2011-11-20 NOTE — Progress Notes (Signed)
Pt wants genetic screening  Pap due today per pt  No void per pt water given

## 2011-11-20 NOTE — Progress Notes (Signed)
Subjective:    Melanie Neal is being seen today for her first obstetrical visit at [redacted]w[redacted]d gestation by LMP and early [place] at [facility].  Denies any issues other than occasional cramping.  Wants VBAC--had primary C/S due to [facility] last pregnancy, with progression of labor to around 5 cm.  Wants Quad screen today.  Her obstetrical history is significant for: Patient Active Problem List  Diagnosis  . Morbid obesity  . 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 Normal IUP (intrauterine pregnancy) on prenatal ultrasound  . Group B streptococcal infection  . Previous cesarean section    Relationship with FOB: Not discussed  Patient does intend to breast feed.  Had inadequate milk production last delivery, wants to breast feed this time.  Pregnancy history fully reviewed.  The following portions of the patient's history were reviewed and updated as appropriate: allergies, current medications, past family history, past medical history, past social history, past surgical history and problem list.  Review of Systems Pertinent ROS is described in HPI   Objective:   BP 120/70  Wt 225 lb (102.059 kg)  LMP 08/11/2011 Wt Readings from Last 1 Encounters:  11/20/11 225 lb (102.059 kg)   BMI: There is no height on file to calculate BMI.  General: alert, cooperative and no distress Respiratory: clear to auscultation bilaterally Cardiovascular: regular rate and rhythm, S1, S2 normal, no murmur Breasts:  No dominant masses, nipples erect Gastrointestinal: soft, non-tender; no masses,  no organomegaly Extremities: extremities normal, no pain or edema Vaginal Bleeding: None  EXTERNAL GENITALIA: normal appearing vulva with no masses, tenderness or lesions VAGINA: no abnormal discharge or lesions CERVIX: no lesions or cervical motion tenderness; cervix closed, long, firm UTERUS: gravid and consistent with 15 weeks ADNEXA: no masses palpable and nontender OB EXAM PELVIMETRY: appears  adequate   FHR:  150  bpm  Assessment:    Pregnancy at  15 2/7 weeks Previous C/S--desires VBAC Plan:     Prenatal panel reviewed and discussed with the patient:yes Pap smear collected:yes GC/Chlamydia collected:yes Wet prep:  NA Discussion of Genetic testing options: [employer] screen Prenatal vitamins recommended Problem list reviewed and updated.  Plan of care: Follow up in 4 weeks for [place] for anatomy Quad screen today. Will need VBAC consent.  Nigel Bridgeman CNM, [place] 11/20/2011 2:29 PM                                                     Quad screen Early glococla   Pap done, cultures [place] at [facility] for anatomy Wants VBAC Wet prep negatve

## 2011-11-21 LAB — AFP, QUAD SCREEN
Curr Gest Age: 14.3 wks.days
Down Syndrome Scr Risk Est: <1:38500 {titer}
HCG, Total: 15230 m[IU]/mL
INH: 139.2 pg/mL
Interpretation-AFP: NEGATIVE
MoM for INH: 0.77
MoM for hCG: 0.5
Open Spina bifida: NEGATIVE
Osb Risk: 1:32000 {titer}
Tri 18 Scr Risk Est: NEGATIVE
uE3 Mom: 1.35

## 2011-11-23 LAB — PAP IG, CT-NG, RFX HPV ASCU: GC Probe Amp: NEGATIVE

## 2011-11-23 LAB — HGB ELECTROPHORESIS REFLEXED REPORT: Hemoglobin A - HGBRFX: 97.8 % (ref >96.0)

## 2011-11-27 ENCOUNTER — Telehealth: Payer: Self-pay

## 2011-11-27 DIAGNOSIS — B9689 Other specified bacterial agents as the cause of diseases classified elsewhere: Secondary | ICD-10-CM

## 2011-11-27 MED ORDER — METRONIDAZOLE 500 MG PO TABS: 500.0000 mg | ORAL_TABLET | Freq: Two times a day (BID) | ORAL | Status: AC

## 2011-11-27 NOTE — Telephone Encounter (Signed)
Spoke with pt regarding pap results. Informed pt due to abn pap Colposcopy is needed per VL & I will call her tomorrow after speaking with [facility] Assistants to schedule due to MD's limited colpo openings. Also informed pt BV on pap Metronidazole 500 mg BID x 7 days sent to [facility]. Pt agrees and voices understanding.

## 2011-11-27 NOTE — Telephone Encounter (Signed)
Message copied by Janeece Agee on Tue Nov 27, 2011  5:12 PM ------      Message from: Cornelius Moras      Created: Fri Nov 23, 2011 12:51 PM       Needs colpo and treatment for BV--Metronidazole 500 mg po BID x 7 days, #14, no refills.

## 2011-12-04 NOTE — Telephone Encounter (Signed)
Called pt to f/u with her regarding colpo appt. Informed pt Dr. Kathrin Ruddy assistant will contact her to set up colpo appointment. Pt agrees and voices understanding.

## 2011-12-05 ENCOUNTER — Telehealth: Payer: Self-pay

## 2011-12-05 NOTE — Telephone Encounter (Signed)
Spoke to pt to get her sched for colpo on 12/24/2011 w/ Dr Su Hilt. Melanie Neal .

## 2011-12-18 ENCOUNTER — Encounter: Payer: Self-pay | Admitting: Obstetrics and Gynecology

## 2011-12-18 ENCOUNTER — Ambulatory Visit (INDEPENDENT_AMBULATORY_CARE_PROVIDER_SITE_OTHER): Payer: Medicaid Other | Admitting: Obstetrics and Gynecology

## 2011-12-18 ENCOUNTER — Other Ambulatory Visit: Payer: Medicaid Other

## 2011-12-18 ENCOUNTER — Ambulatory Visit (INDEPENDENT_AMBULATORY_CARE_PROVIDER_SITE_OTHER): Payer: Medicaid Other

## 2011-12-18 VITALS — BP 126/62 | Wt 228.0 lb

## 2011-12-18 DIAGNOSIS — Z331 Pregnant state, incidental: Secondary | ICD-10-CM

## 2011-12-18 DIAGNOSIS — Z3689 Encounter for other specified antenatal screening: Secondary | ICD-10-CM

## 2011-12-18 LAB — HEMOGLOBIN: Hemoglobin: 11.1 g/dL — ABNORMAL LOW (ref 12.0–15.0)

## 2011-12-18 NOTE — Patient Instructions (Signed)
ABCs of Pregnancy A Antepartum care is very important. Be sure you see your doctor and get prenatal care as soon as you think you are pregnant. At this time, you will be tested for infection, genetic abnormalities and potential problems with you and the pregnancy. This is the time to discuss diet, exercise, work, medications, labor, pain medication during labor and the possibility of a cesarean delivery. Ask any questions that may concern you. It is important to see your doctor regularly throughout your pregnancy. Avoid exposure to toxic substances and chemicals - such as cleaning solvents, lead and mercury, some insecticides, and paint. Pregnant women should avoid exposure to paint fumes, and fumes that cause you to feel ill, dizzy or faint. When possible, it is a good idea to have a pre-pregnancy consultation with your caregiver to begin some important recommendations your caregiver suggests such as, taking folic acid, exercising, quitting smoking, avoiding alcoholic beverages, etc. B Breastfeeding is the healthiest choice for both you and your baby. It has many nutritional benefits for the baby and health benefits for the mother. It also creates a very tight and loving bond between the baby and mother. Talk to your doctor, your family and friends, and your employer about how you choose to feed your baby and how they can support you in your decision. Not all birth defects can be prevented, but a woman can take actions that may increase her chance of having a healthy baby. Many birth defects happen very early in pregnancy, sometimes before a woman even knows she is pregnant. Birth defects or abnormalities of any child in your or the father's family should be discussed with your caregiver. Get a good support bra as your breast size changes. Wear it especially when you exercise and when nursing.  C Celebrate the news of your pregnancy with the your spouse/father and family. Childbirth classes are helpful to  take for you and the spouse/father because it helps to understand what happens during the pregnancy, labor and delivery. Cesarean delivery should be discussed with your doctor so you are prepared for that possibility. The pros and cons of circumcision if it is a boy, should be discussed with your pediatrician. Cigarette smoking during pregnancy can result in low birth weight babies. It has been associated with infertility, miscarriages, tubal pregnancies, infant death (mortality) and poor health (morbidity) in childhood. Additionally, cigarette smoking may cause long-term learning disabilities. If you smoke, you should try to quit before getting pregnant and not smoke during the pregnancy. Secondary smoke may also harm a mother and her developing baby. It is a good idea to ask people to stop smoking around you during your pregnancy and after the baby is born. Extra calcium is necessary when you are pregnant and is found in your prenatal vitamin, in dairy products, green leafy vegetables and in calcium supplements. D A healthy diet according to your current weight and height, along with vitamins and mineral supplements should be discussed with your caregiver. Domestic abuse or violence should be made known to your doctor right away to get the situation corrected. Drink more water when you exercise to keep hydrated. Discomfort of your back and legs usually develops and progresses from the middle of the second trimester through to delivery of the baby. This is because of the enlarging baby and uterus, which may also affect your balance. Do not take illegal drugs. Illegal drugs can seriously harm the baby and you. Drink extra fluids (water is best) throughout pregnancy to help   your body keep up with the increases in your blood volume. Drink at least 6 to 8 glasses of water, fruit juice, or milk each day. A good way to know you are drinking enough fluid is when your urine looks almost like clear water or is very light  yellow.  E Eat healthy to get the nutrients you and your unborn baby need. Your meals should include the five basic food groups. Exercise (30 minutes of light to moderate exercise a day) is important and encouraged during pregnancy, if there are no medical problems or problems with the pregnancy. Exercise that causes discomfort or dizziness should be stopped and reported to your caregiver. Emotions during pregnancy can change from being ecstatic to depression and should be understood by you, your partner and your family. F Fetal screening with ultrasound, amniocentesis and monitoring during pregnancy and labor is common and sometimes necessary. Take 400 micrograms of folic acid daily both before, when possible, and during the first few months of pregnancy to reduce the risk of birth defects of the brain and spine. All women who could possibly become pregnant should take a vitamin with folic acid, every day. It is also important to eat a healthy diet with fortified foods (enriched grain products, including cereals, rice, breads, and pastas) and foods with natural sources of folate (orange juice, green leafy vegetables, beans, peanuts, broccoli, asparagus, peas, and lentils). The father should be involved with all aspects of the pregnancy including, the prenatal care, childbirth classes, labor, delivery, and postpartum time. Fathers may also have emotional concerns about being a father, financial needs, and raising a family. G Genetic testing should be done appropriately. It is important to know your family and the father's history. If there have been problems with pregnancies or birth defects in your family, report these to your doctor. Also, genetic counselors can talk with you about the information you might need in making decisions about having a family. You can call a major medical center in your area for help in finding a board-certified genetic counselor. Genetic testing and counseling should be done  before pregnancy when possible, especially if there is a history of problems in the mother's or father's family. Certain ethnic backgrounds are more at risk for genetic defects. H Get familiar with the hospital where you will be having your baby. Get to know how long it takes to get there, the labor and delivery area, and the hospital procedures. Be sure your medical insurance is accepted there. Get your home ready for the baby including, clothes, the baby's room (when possible), furniture and car seat. Hand washing is important throughout the day, especially after handling raw meat and poultry, changing the baby's diaper or using the bathroom. This can help prevent the spread of many bacteria and viruses that cause infection. Your hair may become dry and thinner, but will return to normal a few weeks after the baby is born. Heartburn is a common problem that can be treated by taking antacids recommended by your caregiver, eating smaller meals 5 or 6 times a day, not drinking liquids when eating, drinking between meals and raising the head of your bed 2 to 3 inches. I Insurance to cover you, the baby, doctor and hospital should be reviewed so that you will be prepared to pay any costs not covered by your insurance plan. If you do not have medical insurance, there are usually clinics and services available for you in your community. Take 30 milligrams of iron during   your pregnancy as prescribed by your doctor to reduce the risk of low red blood cells (anemia) later in pregnancy. All women of childbearing age should eat a diet rich in iron. J There should be a joint effort for the mother, father and any other children to adapt to the pregnancy financially, emotionally, and psychologically during the pregnancy. Join a support group for moms-to-be. Or, join a class on parenting or childbirth. Have the family participate when possible. K Know your limits. Let your caregiver know if you experience any of the  following:   Pain of any kind.   Strong cramps.   You develop a lot of weight in a short period of time (5 pounds in 3 to 5 days).   Vaginal bleeding, leaking of amniotic fluid.   Headache, vision problems.   Dizziness, fainting, shortness of breath.   Chest pain.   Fever of 102 F (38.9 C) or higher.   Gush of clear fluid from your vagina.   Painful urination.   Domestic violence.   Irregular heartbeat (palpitations).   Rapid beating of the heart (tachycardia).   Constant feeling sick to your stomach (nauseous) and vomiting.   Trouble walking, fluid retention (edema).   Muscle weakness.   If your baby has decreased activity.   Persistent diarrhea.   Abnormal vaginal discharge.   Uterine contractions at 20-minute intervals.   Back pain that travels down your leg.  L Learn and practice that what you eat and drink should be in moderation and healthy for you and your baby. Legal drugs such as alcohol and caffeine are important issues for pregnant women. There is no safe amount of alcohol a woman can drink while pregnant. Fetal alcohol syndrome, a disorder characterized by growth retardation, facial abnormalities, and central nervous system dysfunction, is caused by a woman's use of alcohol during pregnancy. Caffeine, found in tea, coffee, soft drinks and chocolate, should also be limited. Be sure to read labels when trying to cut down on caffeine during pregnancy. More than 200 foods, beverages, and over-the-counter medications contain caffeine and have a high salt content! There are coffees and teas that do not contain caffeine. M Medical conditions such as diabetes, epilepsy, and high blood pressure should be treated and kept under control before pregnancy when possible, but especially during pregnancy. Ask your caregiver about any medications that may need to be changed or adjusted during pregnancy. If you are currently taking any medications, ask your caregiver if it  is safe to take them while you are pregnant or before getting pregnant when possible. Also, be sure to discuss any herbs or vitamins you are taking. They are medicines, too! Discuss with your doctor all medications, prescribed and over-the-counter, that you are taking. During your prenatal visit, discuss the medications your doctor may give you during labor and delivery. N Never be afraid to ask your doctor or caregiver questions about your health, the progress of the pregnancy, family problems, stressful situations, and recommendation for a pediatrician, if you do not have one. It is better to take all precautions and discuss any questions or concerns you may have during your office visits. It is a good idea to write down your questions before you visit the doctor. O Over-the-counter cough and cold remedies may contain alcohol or other ingredients that should be avoided during pregnancy. Ask your caregiver about prescription, herbs or over-the-counter medications that you are taking or may consider taking while pregnant.  P Physical activity during pregnancy can   benefit both you and your baby by lessening discomfort and fatigue, providing a sense of well-being, and increasing the likelihood of early recovery after delivery. Light to moderate exercise during pregnancy strengthens the belly (abdominal) and back muscles. This helps improve posture. Practicing yoga, walking, swimming, and cycling on a stationary bicycle are usually safe exercises for pregnant women. Avoid scuba diving, exercise at high altitudes (over 3000 feet), skiing, horseback riding, contact sports, etc. Always check with your doctor before beginning any kind of exercise, especially during pregnancy and especially if you did not exercise before getting pregnant. Q Queasiness, stomach upset and morning sickness are common during pregnancy. Eating a couple of crackers or dry toast before getting out of bed. Foods that you normally love may  make you feel sick to your stomach. You may need to substitute other nutritious foods. Eating 5 or 6 small meals a day instead of 3 large ones may make you feel better. Do not drink with your meals, drink between meals. Questions that you have should be written down and asked during your prenatal visits. R Read about and make plans to baby-proof your home. There are important tips for making your home a safer environment for your baby. Review the tips and make your home safer for you and your baby. Read food labels regarding calories, salt and fat content in the food. S Saunas, hot tubs, and steam rooms should be avoided while you are pregnant. Excessive high heat may be harmful during your pregnancy. Your caregiver will screen and examine you for sexually transmitted diseases and genetic disorders during your prenatal visits. Learn the signs of labor. Sexual relations while pregnant is safe unless there is a medical or pregnancy problem and your caregiver advises against it. T Traveling long distances should be avoided especially in the third trimester of your pregnancy. If you do have to travel out of state, be sure to take a copy of your medical records and medical insurance plan with you. You should not travel long distances without seeing your doctor first. Most airlines will not allow you to travel after 36 weeks of pregnancy. Toxoplasmosis is an infection caused by a parasite that can seriously harm an unborn baby. Avoid eating undercooked meat and handling cat litter. Be sure to wear gloves when gardening. Tingling of the hands and fingers is not unusual and is due to fluid retention. This will go away after the baby is born. U Womb (uterus) size increases during the first trimester. Your kidneys will begin to function more efficiently. This may cause you to feel the need to urinate more often. You may also leak urine when sneezing, coughing or laughing. This is due to the growing uterus pressing  against your bladder, which lies directly in front of and slightly under the uterus during the first few months of pregnancy. If you experience burning along with frequency of urination or bloody urine, be sure to tell your doctor. The size of your uterus in the third trimester may cause a problem with your balance. It is advisable to maintain good posture and avoid wearing high heels during this time. An ultrasound of your baby may be necessary during your pregnancy and is safe for you and your baby. V Vaccinations are an important concern for pregnant women. Get needed vaccines before pregnancy. Center for Disease Control (www.cdc.gov) has clear guidelines for the use of vaccines during pregnancy. Review the list, be sure to discuss it with your doctor. Prenatal vitamins are helpful   and healthy for you and the baby. Do not take extra vitamins except what is recommended. Taking too much of certain vitamins can cause overdose problems. Continuous vomiting should be reported to your caregiver. Varicose veins may appear especially if there is a family history of varicose veins. They should subside after the delivery of the baby. Support hose helps if there is leg discomfort. W Being overweight or underweight during pregnancy may cause problems. Try to get within 15 pounds of your ideal weight before pregnancy. Remember, pregnancy is not a time to be dieting! Do not stop eating or start skipping meals as your weight increases. Both you and your baby need the calories and nutrition you receive from a healthy diet. Be sure to consult with your doctor about your diet. There is a formula and diet plan available depending on whether you are overweight or underweight. Your caregiver or nutritionist can help and advise you if necessary. X Avoid X-rays. If you must have dental work or diagnostic tests, tell your dentist or physician that you are pregnant so that extra care can be taken. X-rays should only be taken when  the risks of not taking them outweigh the risk of taking them. If needed, only the minimum amount of radiation should be used. When X-rays are necessary, protective lead shields should be used to cover areas of the body that are not being X-rayed. Y Your baby loves you. Breastfeeding your baby creates a loving and very close bond between the two of you. Give your baby a healthy environment to live in while you are pregnant. Infants and children require constant care and guidance. Their health and safety should be carefully watched at all times. After the baby is born, rest or take a nap when the baby is sleeping. Z Get your ZZZs. Be sure to get plenty of rest. Resting on your side as often as possible, especially on your left side is advised. It provides the best circulation to your baby and helps reduce swelling. Try taking a nap for 30 to 45 minutes in the afternoon when possible. After the baby is born rest or take a nap when the baby is sleeping. Try elevating your feet for that amount of time when possible. It helps the circulation in your legs and helps reduce swelling.  Most information courtesy of the CDC. Document Released: 03/19/2005 Document Revised: 03/08/2011 Document Reviewed: 12/01/2008 [employer] Patient Information 2012 [employer]. 

## 2011-12-18 NOTE — Progress Notes (Signed)
Glucola given today. 

## 2011-12-18 NOTE — Progress Notes (Signed)
Pt without c/o Quad screen WNL [place] for anatomy s=d  cx 3.52 cm vtx presentation with anterior placenta 5.3 cm fluid pocket Normal anatomy

## 2011-12-18 NOTE — Addendum Note (Signed)
Addended by: Rolla Plate on: 12/18/2011 12:03 PM   Modules accepted: Orders

## 2011-12-19 ENCOUNTER — Telehealth: Payer: Self-pay | Admitting: Obstetrics and Gynecology

## 2011-12-19 LAB — RPR

## 2011-12-19 LAB — GLUCOSE TOLERANCE, 1 HOUR (50G) W/O FASTING: Glucose, 1 Hour GTT: 87 mg/dL (ref 70–140)

## 2011-12-19 NOTE — Telephone Encounter (Signed)
Returned pt's call regarding poss r/s Colpo. Pt stated that she had gotten a sitter for her child and wanted to keep the appt as it stands on 12/24/2011. Mathis Bud

## 2011-12-24 ENCOUNTER — Encounter: Payer: Medicaid Other | Admitting: Obstetrics and Gynecology

## 2011-12-24 ENCOUNTER — Telehealth: Payer: Self-pay | Admitting: Obstetrics and Gynecology

## 2011-12-25 NOTE — Telephone Encounter (Signed)
Ar pt 

## 2011-12-27 ENCOUNTER — Telehealth: Payer: Self-pay | Admitting: Obstetrics and Gynecology

## 2011-12-27 NOTE — Telephone Encounter (Signed)
Pt was called and given rescheduled ov for Colp per Dr. Su Hilt. Pt scheduled for 01/17/2012 @9 :00 am. Pt was advised to follow previous protocol given. Mathis Bud

## 2012-01-15 ENCOUNTER — Encounter: Payer: Self-pay | Admitting: Obstetrics and Gynecology

## 2012-01-15 ENCOUNTER — Ambulatory Visit (INDEPENDENT_AMBULATORY_CARE_PROVIDER_SITE_OTHER): Payer: Medicaid Other | Admitting: Obstetrics and Gynecology

## 2012-01-15 VITALS — BP 108/70 | Wt 235.0 lb

## 2012-01-15 DIAGNOSIS — Z331 Pregnant state, incidental: Secondary | ICD-10-CM

## 2012-01-15 DIAGNOSIS — Z3490 Encounter for supervision of normal pregnancy, unspecified, unspecified trimester: Secondary | ICD-10-CM

## 2012-01-15 NOTE — Progress Notes (Signed)
Doing well.  Has colpo this week with [facility]. Information on procedure reviewed.\ Glucola, RPR, Hgb in 4 weeks.

## 2012-01-15 NOTE — Progress Notes (Signed)
ROB f/u. No complaints except occ mild cramping. Melanie Neal

## 2012-01-17 ENCOUNTER — Encounter: Payer: Medicaid Other | Admitting: Obstetrics and Gynecology

## 2012-02-12 ENCOUNTER — Other Ambulatory Visit: Payer: Medicaid Other

## 2012-02-12 ENCOUNTER — Ambulatory Visit (INDEPENDENT_AMBULATORY_CARE_PROVIDER_SITE_OTHER): Payer: Medicaid Other | Admitting: Obstetrics and Gynecology

## 2012-02-12 ENCOUNTER — Encounter: Payer: Self-pay | Admitting: Obstetrics and Gynecology

## 2012-02-12 VITALS — BP 120/60 | Wt 239.0 lb

## 2012-02-12 DIAGNOSIS — Z331 Pregnant state, incidental: Secondary | ICD-10-CM

## 2012-02-12 DIAGNOSIS — Z23 Encounter for immunization: Secondary | ICD-10-CM

## 2012-02-12 NOTE — Progress Notes (Signed)
[redacted]w[redacted]d 1 gtt given w/o difficulty Pt wants Mirena PP [facility] is the preferred pediatrician  Flu vaccine given today w/o difficulty

## 2012-02-12 NOTE — Progress Notes (Signed)
[redacted]w[redacted]d C/o itching and rash at various places, worse at night, no rash at present, skin appears dry, rv'd using unscented soaps, try aveeno soap/lotion, benadryl at night rv'd healthy eating 1hr gtt today RTO 2wks w MD to discuss VBAC

## 2012-02-12 NOTE — Patient Instructions (Signed)
Fetal Movement Counts Patient Name: __________________________________________________ Patient Due Date: ____________________ Kick counts is highly recommended in high risk pregnancies, but it is a good idea for every pregnant woman to do. Start counting fetal movements at 28 weeks of the pregnancy. Fetal movements increase after eating a full meal or eating or drinking something sweet (the blood sugar is higher). It is also important to drink plenty of fluids (well hydrated) before doing the count. Lie on your left side because it helps with the circulation or you can sit in a comfortable chair with your arms over your belly (abdomen) with no distractions around you. DOING THE COUNT  Try to do the count the same time of day each time you do it.  Mark the day and time, then see how long it takes for you to feel 10 movements (kicks, flutters, swishes, rolls). You should have at least 10 movements within 2 hours. You will most likely feel 10 movements in much less than 2 hours. If you do not, wait an hour and count again. After a couple of days you will see a pattern.  What you are looking for is a change in the pattern or not enough counts in 2 hours. Is it taking longer in time to reach 10 movements? SEEK MEDICAL CARE IF:  You feel less than 10 counts in 2 hours. Tried twice.  No movement in one hour.  The pattern is changing or taking longer each day to reach 10 counts in 2 hours.  You feel the baby is not moving as it usually does. Date: ____________ Movements: ____________ Start time: ____________ Finish time: ____________  Date: ____________ Movements: ____________ Start time: ____________ Finish time: ____________ Date: ____________ Movements: ____________ Start time: ____________ Finish time: ____________ Date: ____________ Movements: ____________ Start time: ____________ Finish time: ____________ Date: ____________ Movements: ____________ Start time: ____________ Finish time:  ____________ Date: ____________ Movements: ____________ Start time: ____________ Finish time: ____________ Date: ____________ Movements: ____________ Start time: ____________ Finish time: ____________ Date: ____________ Movements: ____________ Start time: ____________ Finish time: ____________  Date: ____________ Movements: ____________ Start time: ____________ Finish time: ____________ Date: ____________ Movements: ____________ Start time: ____________ Finish time: ____________ Date: ____________ Movements: ____________ Start time: ____________ Finish time: ____________ Date: ____________ Movements: ____________ Start time: ____________ Finish time: ____________ Date: ____________ Movements: ____________ Start time: ____________ Finish time: ____________ Date: ____________ Movements: ____________ Start time: ____________ Finish time: ____________ Date: ____________ Movements: ____________ Start time: ____________ Finish time: ____________  Date: ____________ Movements: ____________ Start time: ____________ Finish time: ____________ Date: ____________ Movements: ____________ Start time: ____________ Finish time: ____________ Date: ____________ Movements: ____________ Start time: ____________ Finish time: ____________ Date: ____________ Movements: ____________ Start time: ____________ Finish time: ____________ Date: ____________ Movements: ____________ Start time: ____________ Finish time: ____________ Date: ____________ Movements: ____________ Start time: ____________ Finish time: ____________ Date: ____________ Movements: ____________ Start time: ____________ Finish time: ____________  Date: ____________ Movements: ____________ Start time: ____________ Finish time: ____________ Date: ____________ Movements: ____________ Start time: ____________ Finish time: ____________ Date: ____________ Movements: ____________ Start time: ____________ Finish time: ____________ Date: ____________ Movements:  ____________ Start time: ____________ Finish time: ____________ Date: ____________ Movements: ____________ Start time: ____________ Finish time: ____________ Date: ____________ Movements: ____________ Start time: ____________ Finish time: ____________ Date: ____________ Movements: ____________ Start time: ____________ Finish time: ____________  Date: ____________ Movements: ____________ Start time: ____________ Finish time: ____________ Date: ____________ Movements: ____________ Start time: ____________ Finish time: ____________ Date: ____________ Movements: ____________ Start time: ____________ Finish time: ____________ Date: ____________ Movements:   ____________ Start time: ____________ Doreatha Martin time: ____________ Date: ____________ Movements: ____________ Start time: ____________ Doreatha Martin time: ____________ Date: ____________ Movements: ____________ Start time: ____________ Doreatha Martin time: ____________ Date: ____________ Movements: ____________ Start time: ____________ Doreatha Martin time: ____________  Date: ____________ Movements: ____________ Start time: ____________ Doreatha Martin time: ____________ Date: ____________ Movements: ____________ Start time: ____________ Doreatha Martin time: ____________ Date: ____________ Movements: ____________ Start time: ____________ Doreatha Martin time: ____________ Date: ____________ Movements: ____________ Start time: ____________ Doreatha Martin time: ____________ Date: ____________ Movements: ____________ Start time: ____________ Doreatha Martin time: ____________ Date: ____________ Movements: ____________ Start time: ____________ Doreatha Martin time: ____________ Date: ____________ Movements: ____________ Start time: ____________ Doreatha Martin time: ____________  Date: ____________ Movements: ____________ Start time: ____________ Doreatha Martin time: ____________ Date: ____________ Movements: ____________ Start time: ____________ Doreatha Martin time: ____________ Date: ____________ Movements: ____________ Start time: ____________ Doreatha Martin  time: ____________ Date: ____________ Movements: ____________ Start time: ____________ Doreatha Martin time: ____________ Date: ____________ Movements: ____________ Start time: ____________ Doreatha Martin time: ____________ Date: ____________ Movements: ____________ Start time: ____________ Doreatha Martin time: ____________ Date: ____________ Movements: ____________ Start time: ____________ Doreatha Martin time: ____________  Date: ____________ Movements: ____________ Start time: ____________ Doreatha Martin time: ____________ Date: ____________ Movements: ____________ Start time: ____________ Doreatha Martin time: ____________ Date: ____________ Movements: ____________ Start time: ____________ Doreatha Martin time: ____________ Date: ____________ Movements: ____________ Start time: ____________ Doreatha Martin time: ____________ Date: ____________ Movements: ____________ Start time: ____________ Doreatha Martin time: ____________ Date: ____________ Movements: ____________ Start time: ____________ Doreatha Martin time: ____________ Document Released: 04/18/2006 Document Revised: 06/11/2011 Document Reviewed: 10/19/2008 [employer] Patient Information 2013 [employer].  Preterm Labor Preterm labor is when labor starts at less than 37 weeks of pregnancy. The normal length of a pregnancy is 39 to 41 weeks. CAUSES Often, there is no identifiable underlying cause as to why a woman goes into preterm labor. However, one of the most common known causes of preterm labor is infection. Infections of the uterus, cervix, vagina, amniotic sac, bladder, kidney, or even the lungs (pneumonia) can cause labor to start. Other causes of preterm labor include:  Urogenital infections, such as yeast infections and bacterial vaginosis.  Uterine abnormalities (uterine shape, uterine septum, fibroids, bleeding from the placenta).  A cervix that has been operated on and opens prematurely.  Malformations in the baby.  Multiple gestations (twins, triplets, and so on).  Breakage of the amniotic  sac. Additional risk factors for preterm labor include:  Previous history of preterm labor.  Premature rupture of membranes (PROM).  A placenta that covers the opening of the cervix (placenta previa).  A placenta that separates from the uterus (placenta abruption).  A cervix that is too weak to hold the baby in the uterus (incompetence cervix).  Having too much fluid in the amniotic sac (polyhydramnios).  Taking illegal drugs or smoking while pregnant.  Not gaining enough weight while pregnant.  Women younger than 21 and older than 24 years old.  Low socioeconomic status.  African-American ethnicity. SYMPTOMS Signs and symptoms of preterm labor include:  Menstrual-like cramps.  Contractions that are 30 to 70 seconds apart, become very regular, closer together, and are more intense and painful.  Contractions that start on the top of the uterus and spread down to the lower abdomen and back.  A sense of increased pelvic pressure or back pain.  A watery or bloody discharge that comes from the vagina. DIAGNOSIS  A diagnosis can be confirmed by:  A vaginal exam.  An ultrasound of the cervix.  Sampling (swabbing) cervico-vaginal secretions. These samples can be tested for the presence of fetal fibronectin. This is a  protein found in cervical discharge which is associated with preterm labor.  Fetal monitoring. TREATMENT  Depending on the length of the pregnancy and other circumstances, a caregiver may suggest bed rest. If necessary, there are medicines that can be given to stop contractions and to quicken fetal lung maturity. If labor happens before 34 weeks of pregnancy, a prolonged hospital stay may be recommended. Treatment depends on the condition of both the mother and baby. PREVENTION There are some things a mother can do to lower the risk of preterm labor in future pregnancies. A woman can:   Stop smoking.  Maintain healthy weight gain and avoid chemicals and  drugs that are not necessary.  Be watchful for any type of infection.  Inform her caregiver if she has a known history of preterm labor. Document Released: 06/09/2003 Document Revised: 06/11/2011 Document Reviewed: 07/14/2010 [facility] Patient Information 2013 [place], [place].  Contact Dermatitis Contact dermatitis is a reaction to certain substances that touch the skin. Contact dermatitis can be either irritant contact dermatitis or allergic contact dermatitis. Irritant contact dermatitis does not require previous exposure to the substance for a reaction to occur.Allergic contact dermatitis only occurs if you have been exposed to the substance before. Upon a repeat exposure, your body reacts to the substance.  CAUSES  Many substances can cause contact dermatitis. Irritant dermatitis is most commonly caused by repeated exposure to mildly irritating substances, such as:  Makeup.  Soaps.  Detergents.  Bleaches.  Acids.  Metal salts, such as nickel. Allergic contact dermatitis is most commonly caused by exposure to:  Poisonous plants.  Chemicals (deodorants, shampoos).  Jewelry.  Latex.  Neomycin in triple antibiotic cream.  Preservatives in products, including clothing. SYMPTOMS  The area of skin that is exposed may develop:  Dryness or flaking.  Redness.  Cracks.  Itching.  Pain or a burning sensation.  Blisters. With allergic contact dermatitis, there may also be swelling in areas such as the eyelids, mouth, or genitals.  DIAGNOSIS  Your caregiver can usually tell what the problem is by doing a physical exam. In cases where the cause is uncertain and an allergic contact dermatitis is suspected, a patch skin test may be performed to help determine the cause of your dermatitis. TREATMENT Treatment includes protecting the skin from further contact with the irritating substance by avoiding that substance if possible. Barrier creams, powders, and gloves may be  helpful. Your caregiver may also recommend:  Steroid creams or ointments applied 2 times daily. For best results, soak the rash area in cool water for 20 minutes. Then apply the medicine. Cover the area with a plastic wrap. You can store the steroid cream in the refrigerator for a "chilly" effect on your rash. That may decrease itching. Oral steroid medicines may be needed in more severe cases.  Antibiotics or antibacterial ointments if a skin infection is present.  Antihistamine lotion or an antihistamine taken by mouth to ease itching.  Lubricants to keep moisture in your skin.  Burow's solution to reduce redness and soreness or to dry a weeping rash. Mix one packet or tablet of solution in 2 cups cool water. Dip a clean washcloth in the mixture, wring it out a bit, and put it on the affected area. Leave the cloth in place for 30 minutes. Do this as often as possible throughout the day.  Taking several cornstarch or baking soda baths daily if the area is too large to cover with a washcloth. Harsh chemicals, such as alkalis or  acids, can cause skin damage that is like a burn. You should flush your skin for 15 to 20 minutes with cold water after such an exposure. You should also seek immediate medical care after exposure. Bandages (dressings), antibiotics, and pain medicine may be needed for severely irritated skin.  HOME CARE INSTRUCTIONS  Avoid the substance that caused your reaction.  Keep the area of skin that is affected away from hot water, soap, sunlight, chemicals, acidic substances, or anything else that would irritate your skin.  Do not scratch the rash. Scratching may cause the rash to become infected.  You may take cool baths to help stop the itching.  Only take over-the-counter or prescription medicines as directed by your caregiver.  See your caregiver for follow-up care as directed to make sure your skin is healing properly. SEEK MEDICAL CARE IF:   Your condition is not  better after 3 days of treatment.  You seem to be getting worse.  You see signs of infection such as swelling, tenderness, redness, soreness, or warmth in the affected area.  You have any problems related to your medicines. Document Released: 03/16/2000 Document Revised: 06/11/2011 Document Reviewed: 08/22/2010 [facility] Patient Information 2013 [place], [place].

## 2012-02-13 LAB — GLUCOSE TOLERANCE, 1 HOUR (50G) W/O FASTING: Glucose, 1 Hour GTT: 69 mg/dL — ABNORMAL LOW (ref 70–140)

## 2012-02-13 LAB — RPR

## 2012-02-26 ENCOUNTER — Encounter: Payer: Self-pay | Admitting: Obstetrics and Gynecology

## 2012-02-26 ENCOUNTER — Ambulatory Visit (INDEPENDENT_AMBULATORY_CARE_PROVIDER_SITE_OTHER): Payer: Medicaid Other | Admitting: Obstetrics and Gynecology

## 2012-02-26 VITALS — BP 112/68 | Wt 238.0 lb

## 2012-02-26 DIAGNOSIS — Z3490 Encounter for supervision of normal pregnancy, unspecified, unspecified trimester: Secondary | ICD-10-CM

## 2012-02-26 DIAGNOSIS — Z1389 Encounter for screening for other disorder: Secondary | ICD-10-CM

## 2012-02-26 NOTE — Progress Notes (Signed)
[redacted]w[redacted]d Hgb 10.3, GCT neg, RPR NR A+ Reported mild itchy rash worse at night on stomach.  Aveeno not helping.  Rec otc hydrocortisone if no improvement or worsening, let us know. Small hyperpigmented areas on abdomen FKCs Rec iron qd VBAC consent form given. R/B/A discussed.  Pt to return at next visit.  Had C/S at [facility] by Dr. Sydnee Cabal, ROI for records.

## 2012-03-14 ENCOUNTER — Encounter: Payer: Medicaid Other | Admitting: Obstetrics and Gynecology

## 2012-03-18 ENCOUNTER — Encounter: Payer: Medicaid Other | Admitting: Obstetrics and Gynecology

## 2012-03-18 ENCOUNTER — Ambulatory Visit (INDEPENDENT_AMBULATORY_CARE_PROVIDER_SITE_OTHER): Payer: Medicaid Other | Admitting: Obstetrics and Gynecology

## 2012-03-18 VITALS — BP 110/62 | Wt 238.0 lb

## 2012-03-18 DIAGNOSIS — Z331 Pregnant state, incidental: Secondary | ICD-10-CM

## 2012-03-18 DIAGNOSIS — Z01419 Encounter for gynecological examination (general) (routine) without abnormal findings: Secondary | ICD-10-CM

## 2012-03-18 NOTE — Progress Notes (Signed)
[redacted]w[redacted]d  C/O: skin breakout all over her body, pt has been taking benadryl and still no relief. Pt asked about getting another U/S.

## 2012-03-18 NOTE — Progress Notes (Signed)
[redacted]w[redacted]d Rash continues.  Will refer to dermatologist. Return to office in 2 weeks. Dr. Stefano Gaul

## 2012-03-19 ENCOUNTER — Telehealth: Payer: Self-pay | Admitting: Obstetrics and Gynecology

## 2012-03-19 NOTE — Telephone Encounter (Signed)
Pt scheduled at [facility] [phone]  Due to insurance coverage.  App 03/20/12 arrive at 1:30. TC to pt. LM to return call.ASAP.

## 2012-03-19 NOTE — Telephone Encounter (Signed)
Message copied by Mason Jim on Wed Mar 19, 2012  4:45 PM ------      Message from: Janine Limbo      Created: Tue Mar 18, 2012  8:34 PM      Regarding: referred to a dermatologist       Please refer the patient to a dermatologist.            You can call Dr. Terri Piedra or the dermatologist on call at the hospital            Dr. Stefano Gaul

## 2012-03-20 ENCOUNTER — Telehealth: Payer: Self-pay | Admitting: Obstetrics and Gynecology

## 2012-03-20 NOTE — Telephone Encounter (Signed)
Message copied by Mason Jim on Thu Mar 20, 2012  8:49 AM ------      Message from: Janine Limbo      Created: Tue Mar 18, 2012  8:34 PM      Regarding: referred to a dermatologist       Please refer the patient to a dermatologist.            You can call Dr. Terri Piedra or the dermatologist on call at the hospital            Dr. Stefano Gaul

## 2012-03-20 NOTE — Telephone Encounter (Signed)
TC to pt.  Informed of dermatology appt. Pt states has to work today.  Will call to R/S.  Phone # given.

## 2012-03-27 ENCOUNTER — Encounter: Payer: Medicaid Other | Admitting: Obstetrics and Gynecology

## 2012-04-07 ENCOUNTER — Ambulatory Visit (INDEPENDENT_AMBULATORY_CARE_PROVIDER_SITE_OTHER): Payer: Medicaid Other | Admitting: Obstetrics and Gynecology

## 2012-04-07 VITALS — BP 124/68 | Wt 237.0 lb

## 2012-04-07 DIAGNOSIS — N898 Other specified noninflammatory disorders of vagina: Secondary | ICD-10-CM

## 2012-04-07 DIAGNOSIS — O26899 Other specified pregnancy related conditions, unspecified trimester: Secondary | ICD-10-CM

## 2012-04-07 DIAGNOSIS — Z98891 History of uterine scar from previous surgery: Secondary | ICD-10-CM

## 2012-04-07 DIAGNOSIS — Z9889 Other specified postprocedural states: Secondary | ICD-10-CM

## 2012-04-07 DIAGNOSIS — Z331 Pregnant state, incidental: Secondary | ICD-10-CM

## 2012-04-07 LAB — POCT OSOM TRICHOMONAS RAPID TEST: Trichomonas vaginalis: NEGATIVE

## 2012-04-07 LAB — POCT OSOM BVBLUE TEST: Bacterial Vaginosis: POSITIVE

## 2012-04-07 MED ORDER — METRONIDAZOLE 500 MG PO TABS: 500.0000 mg | ORAL_TABLET | Freq: Two times a day (BID) | ORAL | Status: DC

## 2012-04-07 NOTE — Progress Notes (Signed)
108w1d Has + GBS in urine GC/CHL done [facility]; POSITIVE [facility] TRICH: NEGATIVE Rx: Metronidazole  Prior C/S for NRFHRT Wants VBAC if spontaneous labor by 41 wks.  Declines induction and wants C/S scheduled for 41 wks if no labor by then. VBAC consent signed.

## 2012-04-10 ENCOUNTER — Other Ambulatory Visit: Payer: Self-pay

## 2012-04-10 MED ORDER — AZITHROMYCIN 500 MG PO TABS: 1000.0000 mg | ORAL_TABLET | Freq: Once | ORAL | Status: DC

## 2012-04-14 ENCOUNTER — Ambulatory Visit (INDEPENDENT_AMBULATORY_CARE_PROVIDER_SITE_OTHER): Payer: Medicaid Other | Admitting: *Deleted

## 2012-04-14 VITALS — BP 106/58 | Wt 243.0 lb

## 2012-04-14 DIAGNOSIS — A749 Chlamydial infection, unspecified: Secondary | ICD-10-CM

## 2012-04-14 DIAGNOSIS — Z331 Pregnant state, incidental: Secondary | ICD-10-CM

## 2012-04-14 DIAGNOSIS — O98319 Other infections with a predominantly sexual mode of transmission complicating pregnancy, unspecified trimester: Secondary | ICD-10-CM

## 2012-04-14 DIAGNOSIS — A568 Sexually transmitted chlamydial infection of other sites: Secondary | ICD-10-CM

## 2012-04-14 NOTE — Progress Notes (Signed)
Reviewed positive chlamydia and TOC.  Has taken meds. Discussed Pos GBS status. S/S labor, [facility] RTC 1week

## 2012-04-14 NOTE — Progress Notes (Signed)
Pt stated no issues today pt does not want a cervix check today.

## 2012-04-21 ENCOUNTER — Ambulatory Visit: Payer: Medicaid Other | Admitting: Obstetrics and Gynecology

## 2012-04-21 ENCOUNTER — Telehealth: Payer: Self-pay | Admitting: Obstetrics and Gynecology

## 2012-04-21 VITALS — BP 102/68 | Wt 239.0 lb

## 2012-04-21 DIAGNOSIS — Z3490 Encounter for supervision of normal pregnancy, unspecified, unspecified trimester: Secondary | ICD-10-CM

## 2012-04-21 NOTE — Telephone Encounter (Signed)
Repeat C/S scheduled for 05/06/12 @ 11:30 with AR.  -Adrianne Pridgen

## 2012-04-21 NOTE — Progress Notes (Signed)
Pt stated been throwing up this weekend. Pt wants a cervix check today. Pt cant void at this time .  Wants repeat C/S if no labor by 39 weeks--will schedule with available MD, no preferences. Cervix closed, long, vtx -3. Do TOC NV.

## 2012-04-22 ENCOUNTER — Other Ambulatory Visit: Payer: Self-pay | Admitting: Obstetrics and Gynecology

## 2012-04-28 ENCOUNTER — Encounter (HOSPITAL_COMMUNITY): Payer: Self-pay | Admitting: Pharmacist

## 2012-04-29 ENCOUNTER — Encounter: Payer: Self-pay | Admitting: Obstetrics and Gynecology

## 2012-04-29 ENCOUNTER — Ambulatory Visit: Payer: Medicaid Other | Admitting: Obstetrics and Gynecology

## 2012-04-29 VITALS — BP 100/62 | Wt 245.0 lb

## 2012-04-29 DIAGNOSIS — A749 Chlamydial infection, unspecified: Secondary | ICD-10-CM

## 2012-04-29 NOTE — Progress Notes (Signed)
[redacted]w[redacted]d No complaints, reports occ [facility] TOC for CT collected today Rep C/S already sch 2/4 with Dr. Estanislado Pandy Vtx by Leopold's Cx closed by speculum exam Discussed circ

## 2012-04-29 NOTE — Progress Notes (Signed)
[redacted]w[redacted]d Pt has no concerns today. Pt declines cervix check.  TOC today.

## 2012-04-30 LAB — GC/CHLAMYDIA PROBE AMP: CT Probe RNA: NEGATIVE

## 2012-05-05 ENCOUNTER — Encounter (HOSPITAL_COMMUNITY)
Admission: RE | Admit: 2012-05-05 | Discharge: 2012-05-05 | Disposition: A | Discharge status: Home / Self Care | Payer: Medicaid Other | Source: Ambulatory Visit | Attending: Obstetrics and Gynecology | Admitting: Obstetrics and Gynecology

## 2012-05-05 ENCOUNTER — Encounter (HOSPITAL_COMMUNITY): Payer: Self-pay

## 2012-05-05 VITALS — BP 120/80 | Wt 242.0 lb

## 2012-05-05 DIAGNOSIS — A749 Chlamydial infection, unspecified: Secondary | ICD-10-CM

## 2012-05-05 DIAGNOSIS — Z98891 History of uterine scar from previous surgery: Secondary | ICD-10-CM

## 2012-05-05 LAB — TYPE AND SCREEN
ABO/RH(D): A POS
Antibody Screen: NEGATIVE

## 2012-05-05 LAB — CBC
MCV: 83 fL (ref 78.0–100.0)
Platelets: 243 10*3/uL (ref 150–400)
RDW: 14.3 % (ref 11.5–15.5)
WBC: 10.8 10*3/uL — ABNORMAL HIGH (ref 4.0–10.5)

## 2012-05-05 LAB — SURGICAL PCR SCREEN: Staphylococcus aureus: NEGATIVE

## 2012-05-05 NOTE — Patient Instructions (Addendum)
Your procedure is scheduled on:05/06/12  Enter through the Main Entrance at :10 am Pick up desk phone and dial [phone] and inform us of your arrival.  Please call [phone] if you have any problems the morning of surgery.  Remember: Do not eat after midnight:tonight Water ok until 730 am Tuesday    DO NOT wear jewelry, eye make-up, lipstick,body lotion, or dark fingernail polish. Do not shave for 48 hours prior to surgery.  If you are to be admitted after surgery, leave suitcase in car until your room has been assigned. Patients discharged on the day of surgery will not be allowed to drive home.

## 2012-05-06 ENCOUNTER — Inpatient Hospital Stay (HOSPITAL_COMMUNITY)
Admission: AD | Admit: 2012-05-06 | Payer: Medicaid Other | Source: Ambulatory Visit | Admitting: Obstetrics and Gynecology

## 2012-05-06 ENCOUNTER — Encounter (HOSPITAL_COMMUNITY): Payer: Self-pay | Admitting: Anesthesiology

## 2012-05-06 ENCOUNTER — Encounter (HOSPITAL_COMMUNITY)
Admission: AD | Disposition: A | Discharge status: Home / Self Care | Payer: Self-pay | Source: Ambulatory Visit | Attending: Obstetrics and Gynecology

## 2012-05-06 ENCOUNTER — Encounter (HOSPITAL_COMMUNITY): Payer: Self-pay | Admitting: Surgery

## 2012-05-06 ENCOUNTER — Inpatient Hospital Stay (HOSPITAL_COMMUNITY): Payer: Medicaid Other | Admitting: Anesthesiology

## 2012-05-06 ENCOUNTER — Inpatient Hospital Stay (HOSPITAL_COMMUNITY)
Admission: AD | Admit: 2012-05-06 | Discharge: 2012-05-08 | DRG: 766 | Disposition: A | Discharge status: Home / Self Care | Payer: Medicaid Other | Source: Ambulatory Visit | Attending: Obstetrics and Gynecology | Admitting: Obstetrics and Gynecology

## 2012-05-06 ENCOUNTER — Encounter (HOSPITAL_COMMUNITY): Payer: Self-pay | Admitting: *Deleted

## 2012-05-06 DIAGNOSIS — O9903 Anemia complicating the puerperium: Secondary | ICD-10-CM | POA: Diagnosis not present

## 2012-05-06 DIAGNOSIS — O99892 Other specified diseases and conditions complicating childbirth: Secondary | ICD-10-CM | POA: Diagnosis present

## 2012-05-06 DIAGNOSIS — Z22330 Carrier of Group B streptococcus: Secondary | ICD-10-CM

## 2012-05-06 DIAGNOSIS — Z01812 Encounter for preprocedural laboratory examination: Secondary | ICD-10-CM

## 2012-05-06 DIAGNOSIS — Z98891 History of uterine scar from previous surgery: Secondary | ICD-10-CM

## 2012-05-06 DIAGNOSIS — E669 Obesity, unspecified: Secondary | ICD-10-CM | POA: Diagnosis present

## 2012-05-06 DIAGNOSIS — D649 Anemia, unspecified: Secondary | ICD-10-CM | POA: Diagnosis not present

## 2012-05-06 DIAGNOSIS — O34219 Maternal care for unspecified type scar from previous cesarean delivery: Principal | ICD-10-CM | POA: Diagnosis present

## 2012-05-06 DIAGNOSIS — Z01818 Encounter for other preprocedural examination: Secondary | ICD-10-CM

## 2012-05-06 DIAGNOSIS — IMO0001 Reserved for inherently not codable concepts without codable children: Secondary | ICD-10-CM

## 2012-05-06 SURGERY — Surgical Case
Anesthesia: Regional

## 2012-05-06 SURGERY — Surgical Case
Anesthesia: Spinal | Site: Abdomen | Wound class: Clean Contaminated

## 2012-05-06 MED ORDER — LACTATED RINGERS IV SOLN
INTRAVENOUS | Status: DC
  Administered 2012-05-06: via INTRAVENOUS

## 2012-05-06 MED ORDER — LACTATED RINGERS IV SOLN
INTRAVENOUS | Status: DC | PRN
  Administered 2012-05-06: 08:00:00 via INTRAVENOUS

## 2012-05-06 MED ORDER — LACTATED RINGERS IV BOLUS (SEPSIS)
1000.0000 mL | Freq: Once | INTRAVENOUS | Status: AC
  Administered 2012-05-06: 1000 mL via INTRAVENOUS

## 2012-05-06 MED ORDER — FAMOTIDINE IN NACL 20-0.9 MG/50ML-% IV SOLN: 20.0000 mg | Freq: Once | INTRAVENOUS | Status: DC

## 2012-05-06 MED ORDER — ZOLPIDEM TARTRATE 5 MG PO TABS: 5.0000 mg | ORAL_TABLET | Freq: Every evening | ORAL | Status: DC | PRN

## 2012-05-06 MED ORDER — NALBUPHINE HCL 10 MG/ML IJ SOLN
5.0000 mg | INTRAMUSCULAR | Status: DC | PRN
  Filled 2012-05-06: qty 1

## 2012-05-06 MED ORDER — DIBUCAINE 1 % RE OINT: 1.0000 "application " | TOPICAL_OINTMENT | RECTAL | Status: DC | PRN

## 2012-05-06 MED ORDER — KETOROLAC TROMETHAMINE 30 MG/ML IJ SOLN
INTRAMUSCULAR | Status: AC
  Filled 2012-05-06: qty 1

## 2012-05-06 MED ORDER — FENTANYL CITRATE 0.05 MG/ML IJ SOLN
INTRAMUSCULAR | Status: DC | PRN
  Administered 2012-05-06 (×2): 100 ug via INTRAVENOUS

## 2012-05-06 MED ORDER — FENTANYL CITRATE 0.05 MG/ML IJ SOLN
INTRAMUSCULAR | Status: AC
  Filled 2012-05-06: qty 2

## 2012-05-06 MED ORDER — CEFAZOLIN SODIUM-DEXTROSE 2-3 GM-% IV SOLR
2.0000 g | INTRAVENOUS | Status: AC
  Administered 2012-05-06: 2 g via INTRAVENOUS
  Filled 2012-05-06: qty 50

## 2012-05-06 MED ORDER — OXYTOCIN 10 UNIT/ML IJ SOLN
40.0000 [IU] | INTRAVENOUS | Status: DC | PRN
  Administered 2012-05-06: 40 [IU] via INTRAVENOUS

## 2012-05-06 MED ORDER — CITRIC ACID-SODIUM CITRATE 334-500 MG/5ML PO SOLN
30.0000 mL | Freq: Once | ORAL | Status: DC
  Filled 2012-05-06: qty 15

## 2012-05-06 MED ORDER — DIPHENHYDRAMINE HCL 25 MG PO CAPS: 25.0000 mg | ORAL_CAPSULE | ORAL | Status: DC | PRN

## 2012-05-06 MED ORDER — SCOPOLAMINE 1 MG/3DAYS TD PT72: 1.0000 | MEDICATED_PATCH | Freq: Once | TRANSDERMAL | Status: DC

## 2012-05-06 MED ORDER — ACETAMINOPHEN 10 MG/ML IV SOLN
1000.0000 mg | Freq: Four times a day (QID) | INTRAVENOUS | Status: AC | PRN
  Filled 2012-05-06: qty 100

## 2012-05-06 MED ORDER — FENTANYL CITRATE 0.05 MG/ML IJ SOLN
25.0000 ug | INTRAMUSCULAR | Status: DC | PRN
  Administered 2012-05-06: 50 ug via INTRAVENOUS

## 2012-05-06 MED ORDER — MEPERIDINE HCL 25 MG/ML IJ SOLN: 6.2500 mg | INTRAMUSCULAR | Status: DC | PRN

## 2012-05-06 MED ORDER — MIDAZOLAM HCL 2 MG/2ML IJ SOLN: 0.5000 mg | Freq: Once | INTRAMUSCULAR | Status: DC | PRN

## 2012-05-06 MED ORDER — NALOXONE HCL 0.4 MG/ML IJ SOLN: 0.4000 mg | INTRAMUSCULAR | Status: DC | PRN

## 2012-05-06 MED ORDER — DIPHENHYDRAMINE HCL 25 MG PO CAPS: 25.0000 mg | ORAL_CAPSULE | Freq: Four times a day (QID) | ORAL | Status: DC | PRN

## 2012-05-06 MED ORDER — ONDANSETRON HCL 4 MG/2ML IJ SOLN: 4.0000 mg | INTRAMUSCULAR | Status: DC | PRN

## 2012-05-06 MED ORDER — OXYCODONE-ACETAMINOPHEN 5-325 MG PO TABS
1.0000 | ORAL_TABLET | ORAL | Status: DC | PRN
  Administered 2012-05-07 (×3): 1 via ORAL
  Administered 2012-05-08: 2 via ORAL
  Filled 2012-05-06: qty 1
  Filled 2012-05-06: qty 2
  Filled 2012-05-06 (×2): qty 1

## 2012-05-06 MED ORDER — MENTHOL 3 MG MT LOZG: 1.0000 | LOZENGE | OROMUCOSAL | Status: DC | PRN

## 2012-05-06 MED ORDER — PHENYLEPHRINE HCL 10 MG/ML IJ SOLN
INTRAMUSCULAR | Status: DC | PRN
  Administered 2012-05-06 (×3): 40 ug via INTRAVENOUS

## 2012-05-06 MED ORDER — CITRIC ACID-SODIUM CITRATE 334-500 MG/5ML PO SOLN
ORAL | Status: AC
  Administered 2012-05-06: 30 mL
  Filled 2012-05-06: qty 15

## 2012-05-06 MED ORDER — SCOPOLAMINE 1 MG/3DAYS TD PT72
MEDICATED_PATCH | TRANSDERMAL | Status: AC
  Filled 2012-05-06: qty 1

## 2012-05-06 MED ORDER — SIMETHICONE 80 MG PO CHEW
80.0000 mg | CHEWABLE_TABLET | Freq: Three times a day (TID) | ORAL | Status: DC
  Administered 2012-05-06 – 2012-05-08 (×6): 80 mg via ORAL

## 2012-05-06 MED ORDER — FAMOTIDINE IN NACL 20-0.9 MG/50ML-% IV SOLN
INTRAVENOUS | Status: AC
  Administered 2012-05-06: 20 mg
  Filled 2012-05-06: qty 50

## 2012-05-06 MED ORDER — METOCLOPRAMIDE HCL 5 MG/ML IJ SOLN: 10.0000 mg | Freq: Three times a day (TID) | INTRAMUSCULAR | Status: DC | PRN

## 2012-05-06 MED ORDER — SIMETHICONE 80 MG PO CHEW: 80.0000 mg | CHEWABLE_TABLET | ORAL | Status: DC | PRN

## 2012-05-06 MED ORDER — 0.9 % SODIUM CHLORIDE (POUR BTL) OPTIME
TOPICAL | Status: DC | PRN
  Administered 2012-05-06: 1000 mL

## 2012-05-06 MED ORDER — MORPHINE SULFATE (PF) 0.5 MG/ML IJ SOLN
INTRAMUSCULAR | Status: DC | PRN
  Administered 2012-05-06: 1 mg via INTRAVENOUS
  Administered 2012-05-06: 4 mg via EPIDURAL

## 2012-05-06 MED ORDER — SENNOSIDES-DOCUSATE SODIUM 8.6-50 MG PO TABS
2.0000 | ORAL_TABLET | Freq: Every day | ORAL | Status: DC
  Administered 2012-05-06 – 2012-05-07 (×2): 2 via ORAL

## 2012-05-06 MED ORDER — KETOROLAC TROMETHAMINE 30 MG/ML IJ SOLN: 30.0000 mg | Freq: Four times a day (QID) | INTRAMUSCULAR | Status: AC | PRN

## 2012-05-06 MED ORDER — ONDANSETRON HCL 4 MG/2ML IJ SOLN: 4.0000 mg | Freq: Three times a day (TID) | INTRAMUSCULAR | Status: DC | PRN

## 2012-05-06 MED ORDER — DEXTROSE 5 % IV SOLN: 3.0000 g | Freq: Once | INTRAVENOUS | Status: DC

## 2012-05-06 MED ORDER — SODIUM CHLORIDE 0.9 % IJ SOLN: 3.0000 mL | INTRAMUSCULAR | Status: DC | PRN

## 2012-05-06 MED ORDER — PROMETHAZINE HCL 25 MG/ML IJ SOLN: 6.2500 mg | INTRAMUSCULAR | Status: DC | PRN

## 2012-05-06 MED ORDER — ONDANSETRON HCL 4 MG PO TABS: 4.0000 mg | ORAL_TABLET | ORAL | Status: DC | PRN

## 2012-05-06 MED ORDER — TETANUS-DIPHTH-ACELL PERTUSSIS 5-2.5-18.5 LF-MCG/0.5 IM SUSP
0.5000 mL | Freq: Once | INTRAMUSCULAR | Status: AC
  Administered 2012-05-06: 0.5 mL via INTRAMUSCULAR

## 2012-05-06 MED ORDER — LANOLIN HYDROUS EX OINT: 1.0000 "application " | TOPICAL_OINTMENT | CUTANEOUS | Status: DC | PRN

## 2012-05-06 MED ORDER — ONDANSETRON HCL 4 MG/2ML IJ SOLN
INTRAMUSCULAR | Status: DC | PRN
  Administered 2012-05-06: 4 mg via INTRAVENOUS

## 2012-05-06 MED ORDER — IBUPROFEN 600 MG PO TABS
600.0000 mg | ORAL_TABLET | Freq: Four times a day (QID) | ORAL | Status: DC
  Administered 2012-05-06 – 2012-05-08 (×8): 600 mg via ORAL
  Filled 2012-05-06 (×7): qty 1

## 2012-05-06 MED ORDER — OXYTOCIN 40 UNITS IN LACTATED RINGERS INFUSION - SIMPLE MED: 62.5000 mL/h | INTRAVENOUS | Status: AC

## 2012-05-06 MED ORDER — DIPHENHYDRAMINE HCL 50 MG/ML IJ SOLN: 12.5000 mg | INTRAMUSCULAR | Status: DC | PRN

## 2012-05-06 MED ORDER — PRENATAL MULTIVITAMIN CH
1.0000 | ORAL_TABLET | Freq: Every day | ORAL | Status: DC
  Administered 2012-05-07 – 2012-05-08 (×2): 1 via ORAL
  Filled 2012-05-06 (×2): qty 1

## 2012-05-06 MED ORDER — DIPHENHYDRAMINE HCL 50 MG/ML IJ SOLN: 25.0000 mg | INTRAMUSCULAR | Status: DC | PRN

## 2012-05-06 MED ORDER — WITCH HAZEL-GLYCERIN EX PADS: 1.0000 "application " | MEDICATED_PAD | CUTANEOUS | Status: DC | PRN

## 2012-05-06 MED ORDER — KETOROLAC TROMETHAMINE 30 MG/ML IJ SOLN
30.0000 mg | Freq: Four times a day (QID) | INTRAMUSCULAR | Status: AC | PRN
  Administered 2012-05-06: 30 mg via INTRAVENOUS

## 2012-05-06 MED ORDER — NALOXONE HCL 1 MG/ML IJ SOLN
1.0000 ug/kg/h | INTRAVENOUS | Status: DC | PRN
  Filled 2012-05-06: qty 2

## 2012-05-06 SURGICAL SUPPLY — 36 items
APL SKNCLS STERI-STRIP NONHPOA (GAUZE/BANDAGES/DRESSINGS) ×1
BENZOIN TINCTURE PRP APPL 2/3 (GAUZE/BANDAGES/DRESSINGS) ×2 IMPLANT
CLOTH BEACON ORANGE TIMEOUT ST (SAFETY) ×2 IMPLANT
CONTAINER PREFILL 10% NBF 15ML (MISCELLANEOUS) IMPLANT
DRAPE LG THREE QUARTER DISP (DRAPES) ×2 IMPLANT
DRSG OPSITE POSTOP 4X10 (GAUZE/BANDAGES/DRESSINGS) ×2 IMPLANT
DURAPREP 26ML APPLICATOR (WOUND CARE) ×2 IMPLANT
ELECT REM PT RETURN 9FT ADLT (ELECTROSURGICAL) ×2
ELECTRODE REM PT RTRN 9FT ADLT (ELECTROSURGICAL) ×1 IMPLANT
EXTRACTOR VACUUM M CUP 4 TUBE (SUCTIONS) IMPLANT
GLOVE BIO SURGEON STRL SZ7.5 (GLOVE) ×4 IMPLANT
GLOVE BIOGEL PI IND STRL 7.5 (GLOVE) ×1 IMPLANT
GLOVE BIOGEL PI INDICATOR 7.5 (GLOVE) ×1
GOWN PREVENTION PLUS LG XLONG (DISPOSABLE) ×6 IMPLANT
KIT ABG SYR 3ML LUER SLIP (SYRINGE) IMPLANT
NDL HYPO 25X5/8 SAFETYGLIDE (NEEDLE) IMPLANT
NEEDLE HYPO 22GX1.5 SAFETY (NEEDLE) IMPLANT
NEEDLE HYPO 25X5/8 SAFETYGLIDE (NEEDLE) IMPLANT
NS IRRIG 1000ML POUR BTL (IV SOLUTION) ×2 IMPLANT
PACK C SECTION WH (CUSTOM PROCEDURE TRAY) ×2 IMPLANT
PAD OB MATERNITY 4.3X12.25 (PERSONAL CARE ITEMS) ×2 IMPLANT
RETRACTOR WND ALEXIS 25 LRG (MISCELLANEOUS) ×1 IMPLANT
RTRCTR WOUND ALEXIS 25CM LRG (MISCELLANEOUS) ×2
SLEEVE SCD COMPRESS KNEE MED (MISCELLANEOUS) IMPLANT
STRIP CLOSURE SKIN 1/2X4 (GAUZE/BANDAGES/DRESSINGS) ×2 IMPLANT
SUT CHROMIC 2 0 CT 1 (SUTURE) ×2 IMPLANT
SUT MNCRL AB 3-0 PS2 27 (SUTURE) ×2 IMPLANT
SUT PLAIN 0 NONE (SUTURE) IMPLANT
SUT PLAIN 2 0 XLH (SUTURE) ×2 IMPLANT
SUT VIC AB 0 CT1 36 (SUTURE) ×2 IMPLANT
SUT VIC AB 0 CTX 36 (SUTURE) ×6
SUT VIC AB 0 CTX36XBRD ANBCTRL (SUTURE) ×3 IMPLANT
SYR CONTROL 10ML LL (SYRINGE) IMPLANT
TOWEL OR 17X24 6PK STRL BLUE (TOWEL DISPOSABLE) ×6 IMPLANT
TRAY FOLEY CATH 14FR (SET/KITS/TRAYS/PACK) ×2 IMPLANT
WATER STERILE IRR 1000ML POUR (IV SOLUTION) ×2 IMPLANT

## 2012-05-06 NOTE — H&P (Signed)
HPI: Melanie Neal is a 27 y.o. year old G48P1001 female at [redacted]w[redacted]d weeks gestation by 7 weeks [place] who presents to [facility] reporting contractions since 2000 last night. She denies LOF or VB. Pos FM. She is scheduled for repeat C/S at 1000. Prior C/S in 2009 for NRFHTs. Op note reviewed. Initiated prenatal care at 15 weeks.   Patient Active Problem List  Diagnosis  . Morbid obesity  . 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 Normal IUP (intrauterine pregnancy) on prenatal ultrasound  . Group B streptococcal infection-urine  . Previous cesarean section-LTCS, 2 layer closure  . Chlamydia infection, current pregnancy-TOC neg   Maternal Medical History:  Reason for admission: Reason for Admission:   nausea  OB History    Grav Para Term Preterm Abortions TAB SAB Ect Mult Living   2 1 1       1      Past Medical History  Diagnosis Date  . No pertinent past medical history   . Complication of anesthesia 2009    Anesthesia did not last long;  . Abnormal Pap smear 2009    Was to have f/u;but did not have done;last pap 2009 was not normal  . Infection     yeast inf; not frequently  . Infection     UTI;not frequently  . Anemia     during prev pregnancy;iron supplements taken   Past Surgical History  Procedure Date  . Cesarean section    Family History: family history includes Asthma in her other and Hypertension in her mother.  There is no history of Other. Social History:  reports that she has never smoked. She has never used smokeless tobacco. She reports that she does not drink alcohol or use illicit drugs.   Prenatal Transfer Tool  Maternal Diabetes: No Genetic Screening: Normal Maternal Ultrasounds/Referrals: Normal Fetal Ultrasounds or other Referrals:  None Maternal Substance Abuse:  No Significant Maternal Medications:  None Significant Maternal Lab Results:  Lab values include: Group B Strep positive Other Comments:  Chlamydia pos, TOC neg 04/28/12.  Review of  Systems  Constitutional: Negative for fever and chills.  Eyes: Negative for blurred vision.  Respiratory: Negative for shortness of breath.   Cardiovascular: Negative for chest pain.  Gastrointestinal: Positive for abdominal pain (contractions). Negative for nausea and vomiting.  Neurological: Negative for headaches.    Dilation: 5 Effacement (%): 50 Station: -3 Exam by:: Romaine Maciolek,CNM Blood pressure 128/74, pulse 85, temperature 98.1 F (36.7 C), temperature source Oral, resp. rate 20, height 5\' 4"  (1.626 m), weight 109.77 kg (242 lb), last menstrual period 08/11/2011, SpO2 100.00%. Maternal Exam:  Uterine Assessment: Contraction strength is moderate.  Contraction frequency is irregular.   Abdomen: Fetal presentation: vertex  Introitus: Normal vulva. Cervix: Cervix evaluated by digital exam.     Fetal Exam Fetal Monitor Review: Mode: ultrasound.   Baseline rate: 150.  Variability: moderate (6-25 bpm).   Pattern: accelerations present, early decelerations and variable decelerations.    Fetal State Assessment: Category II - tracings are indeterminate.    Decels improved w/ position change.  Physical Exam  Constitutional: She is oriented to person, place, and time. She appears well-developed and well-nourished. No distress.  HENT:  Head: Normocephalic.  Cardiovascular: Normal rate and regular rhythm.   Murmur heard. Respiratory: Effort normal and breath sounds normal.  GI: Soft. There is no tenderness.  Musculoskeletal: Normal range of motion. She exhibits no edema.  Neurological: She is alert and oriented to person,  place, and time. She has normal reflexes.  Skin: Skin is warm and dry.          [place]  Psychiatric: She has a normal mood and affect.    Prenatal labs: ABO, Rh: --/--/A POS, A POS (02/03 0935) Antibody: NEG (02/03 0935) Rubella: 22.1 (08/16 1050) RPR: NON REACTIVE (02/03 0935)  HBsAg: NEGATIVE (08/16 1050)  HIV: Non-reactive (08/17 0000)  GBS: Positive  (06/24 0000)  1 hour GTT: 69 Quad screen neg  Assessment: 1. Labor: Early-active 2. Fetal Wellbeing: Category II  3. Pain Control: None. Plan spinal in OR. 4. GBS: pos 5. 39.2 week IUP  Plan:  1. Admit per consult with Dr. Estanislado Pandy for repeat C/S. Will notify OR of need to move up to C/S for decels, dilation. 2. Routine OR orders. IV bolus now. 3. ABX in OR. 4. Prep for OR.  Dorathy Kinsman 05/06/2012, 6:47 AM

## 2012-05-06 NOTE — Anesthesia Postprocedure Evaluation (Signed)
  Anesthesia Post-op Note  Patient: Melanie Neal  Procedure(s) Performed: Procedure(s) (LRB) with comments: CESAREAN SECTION (N/A)  Patient Location: Mother/Baby  Anesthesia Type:Epidural  Level of Consciousness: awake, alert  and oriented  Airway and Oxygen Therapy: Patient Spontanous Breathing  Post-op Pain: none  Post-op Assessment: Post-op Vital signs reviewed and Patient's Cardiovascular Status Stable  Post-op Vital Signs: Reviewed and stable  Complications: No apparent anesthesia complications

## 2012-05-06 NOTE — Anesthesia Procedure Notes (Addendum)
Epidural   Spinal  Patient location during procedure: OR Preanesthetic Checklist Completed: patient identified, site marked, surgical consent, pre-op evaluation, timeout performed, IV checked, risks and benefits discussed and monitors and equipment checked Spinal Block Patient position: sitting Prep: DuraPrep Patient monitoring: cardiac monitor, continuous pulse ox, blood pressure and heart rate Approach: midline Location: L3-4 Injection technique: catheter Needle Needle type: Tuohy and Sprotte  Needle gauge: 24 G Needle length: 12.7 cm Needle insertion depth: 8 cm Catheter type: closed end flexible Catheter size: 19 g Catheter at skin depth: 16 cm Additional Notes Spinal Dosage in OR  Bupivicaine ml       1.5

## 2012-05-06 NOTE — Anesthesia Postprocedure Evaluation (Signed)
  Anesthesia Post-op Note  Patient: Melanie Neal  Procedure(s) Performed: Procedure(s) (LRB) with comments: CESAREAN SECTION (N/A)   Patient is awake, responsive, moving her legs, and has signs of resolution of her numbness. Pain and nausea are reasonably well controlled. Vital signs are stable and clinically acceptable. Oxygen saturation is clinically acceptable. There are no apparent anesthetic complications at this time. Patient is ready for discharge.

## 2012-05-06 NOTE — MAU Note (Signed)
Contractions since 8 pm, scheduled c/s for 10:00 this am

## 2012-05-06 NOTE — Anesthesia Postprocedure Evaluation (Deleted)
  Anesthesia Post-op Note  Patient: Melanie Neal  Procedure(s) Performed: Procedure(s) (LRB) with comments: CESAREAN SECTION (N/A)  Patient Location: PACU  Anesthesia Type:Epidural  Level of Consciousness: awake, alert , oriented and patient cooperative  Airway and Oxygen Therapy: Patient Spontanous Breathing  Post-op Pain: none  Post-op Assessment: Post-op Vital signs reviewed and Patient's Cardiovascular Status Stable  Post-op Vital Signs: Reviewed and stable  Complications: No apparent anesthesia complications

## 2012-05-06 NOTE — Anesthesia Preprocedure Evaluation (Signed)
Anesthesia Evaluation  Patient identified by MRN, date of birth, ID band Patient awake    Reviewed: Allergy & Precautions, H&P , NPO status , Patient's Chart, lab work & pertinent test results  Airway Mallampati: III      Dental No notable dental hx.    Pulmonary neg pulmonary ROS,  breath sounds clear to auscultation  Pulmonary exam normal       Cardiovascular Exercise Tolerance: Good negative cardio ROS  Rhythm:regular Rate:Normal     Neuro/Psych negative neurological ROS  negative psych ROS   GI/Hepatic negative GI ROS, Neg liver ROS,   Endo/Other  negative endocrine ROSMorbid obesity  Renal/GU negative Renal ROS  negative genitourinary   Musculoskeletal   Abdominal Normal abdominal exam  (+)   Peds  Hematology negative hematology ROS (+) anemia ,   Anesthesia Other Findings No pertinent past medical history     Complication of anesthesia 2009 Anesthesia did not last long;    Abnormal Pap smear 2009 Was to have f/u;but did not have done;last pap 2009 was not normal Infection   yeast inf; not frequently    Infection   UTI;not frequently Anemia   during prev pregnancy;iron supplements taken    Reproductive/Obstetrics (+) Pregnancy                           Anesthesia Physical Anesthesia Plan  ASA: III and emergent  Anesthesia Plan: Spinal   Post-op Pain Management:    Induction:   Airway Management Planned:   Additional Equipment:   Intra-op Plan:   Post-operative Plan:   Informed Consent: I have reviewed the patients History and Physical, chart, labs and discussed the procedure including the risks, benefits and alternatives for the proposed anesthesia with the patient or authorized representative who has indicated his/her understanding and acceptance.     Plan Discussed with: Anesthesiologist, CRNA and Surgeon  Anesthesia Plan Comments:         Anesthesia Quick  Evaluation

## 2012-05-06 NOTE — Addendum Note (Signed)
Addendum  created 05/06/12 1308 by Gertie Fey, CRNA   Modules edited:Notes Section

## 2012-05-06 NOTE — Transfer of Care (Signed)
Immediate Anesthesia Transfer of Care Note  Patient: Melanie Neal  Procedure(s) Performed: Procedure(s) (LRB) with comments: CESAREAN SECTION (N/A)  Patient Location: PACU  Anesthesia Type:Epidural  Level of Consciousness: awake, alert , oriented and patient cooperative  Airway & Oxygen Therapy: Patient Spontanous Breathing  Post-op Assessment: Report given to PACU RN, Post -op Vital signs reviewed and stable and Patient moving all extremities X 4  Post vital signs: Reviewed and stable  Complications: No apparent anesthesia complications

## 2012-05-06 NOTE — Op Note (Signed)
Cesarean Section Procedure Note  Indications: 39 2/7wks presenting in labor the day of scheduled c-section  Pre-operative Diagnosis: Previous Cesarean Section and labor   Post-operative Diagnosis: Previous Cesarean Section and labor  Procedure: CESAREAN SECTION  Surgeon: Osborn Coho, MD    Assistants: n/a  Anesthesia: Spinal  Anesthesiologist: Jiles Garter, MD   Procedure Details  The patient was taken to the operating room secondary to labor and h/o c-section after the risks, benefits, complications, treatment options, and expected outcomes were discussed with the patient.  The patient concurred with the proposed plan, giving informed consent which was signed and witnessed. The patient was taken to Operating Room C-Suite, identified as [place] and the procedure verified as C-Section Delivery. A Time Out was held and the above information confirmed.  After induction of anesthesia by obtaining a spinal, the patient was prepped and draped in the usual sterile manner. A Pfannenstiel skin incision was made and carried down through the subcutaneous tissue to the underlying layer of fascia.  The fascia was incised bilaterally and extended transversely bilaterally with the Mayo scissors. Kocher clamps were placed on the inferior aspect of the fascial incision and the underlying rectus muscle was separated from the fascia. The same was done on the superior aspect of the fascial incision.  The peritoneum was identified, entered sharply and extended manually.  An adhesion of omentum to anterior abdominal wall was excised with the bovie.  The utero-vesical peritoneal reflection was incised transversely and the bladder flap was bluntly freed from the lower uterine segment. A low transverse uterine incision was made with the scalpel and extended bilaterally with the bandage scissors.  The infant was delivered in vertex position without difficulty.  After the umbilical cord was clamped and  cut, the infant was handed to the awaiting pediatricians.  Cord blood was obtained for evaluation.  The placenta was removed intact and appeared to be within normal limits. The uterus was cleared of all clots and debris. The uterine incision was closed with running interlocking sutures of 0 Vicryl and a second imbricating layer was performed as well.   Bilateral tubes and ovaries appeared to be within normal limits.  Good hemostasis was noted.  Copious irrigation was performed until clear.  The peritoneum was repaired with 2-0 chromic via a running suture.  The fascia was reapproximated with a running suture of 0 Vicryl. The subcutaneous tissue was reapproximated with 3 interrupted sutures of 2-0 plain.  The skin was reapproximated with a subcuticular suture of 3-0 monocryl.  Steristrips were applied with benzoin.  Instrument, sponge, and needle counts were correct prior to abdominal closure and at the conclusion of the case.  The patient was awaiting transfer to the recovery room in good condition.  Findings: Live female infant with Apgars 9 at one minute and 9 at five minutes.  Normal appearing bilateral ovaries and fallopian tubes were noted.  Estimated Blood Loss:  600 ml         Drains: foley to gravity 100 ml         Total IV Fluids: 2900 ml         Specimens to Pathology: Placenta         Complications:  None; patient tolerated the procedure well.         Disposition: PACU - hemodynamically stable.         Condition: stable  Attending Attestation: I performed the procedure.

## 2012-05-06 NOTE — Progress Notes (Signed)
Dr. Su Hilt viewed tracing of variable decel, pt being prepped @ present.

## 2012-05-07 ENCOUNTER — Encounter (HOSPITAL_COMMUNITY): Payer: Self-pay | Admitting: Obstetrics and Gynecology

## 2012-05-07 LAB — CBC
HCT: 28.5 % — ABNORMAL LOW (ref 36.0–46.0)
RDW: 14.2 % (ref 11.5–15.5)
WBC: 11.2 10*3/uL — ABNORMAL HIGH (ref 4.0–10.5)

## 2012-05-07 NOTE — Progress Notes (Signed)
Subjective: Postpartum Day 1: Cesarean Delivery due to repeat Patient up ad lib, reports no syncope or dizziness. Feeding:  Breast Contraceptive plan:  Mirena  Objective: Vital signs in last 24 hours: Temp:  [98 F (36.7 C)-98.8 F (37.1 C)] 98.1 F (36.7 C) (02/05 0550) Pulse Rate:  [73-96] 80  (02/05 0550) Resp:  [12-28] 18  (02/05 0550) BP: (95-152)/(55-110) 100/65 mmHg (02/05 0550) SpO2:  [95 %-100 %] 99 % (02/05 0230)  Physical Exam:  General: alert Lochia: appropriate Uterine Fundus: firm Incision: Dressing CDI DVT Evaluation: No evidence of DVT seen on physical exam. Negative Homan's sign. JP drain:   NA   Basename 05/07/12 0543 05/05/12 0935  HGB 9.3* 10.9*  HCT 28.5* 33.3*    Assessment/Plan: Status post Cesarean section day 1. Doing well postoperatively.  Continue current care. Patient may desire d/c tomorrow.    Nigel Bridgeman 05/07/2012, 7:49 AM

## 2012-05-08 ENCOUNTER — Encounter (HOSPITAL_COMMUNITY): Payer: Self-pay | Admitting: *Deleted

## 2012-05-08 MED ORDER — PROMETHAZINE HCL 12.5 MG PO TABS: 12.5000 mg | ORAL_TABLET | Freq: Four times a day (QID) | ORAL | Status: DC | PRN

## 2012-05-08 MED ORDER — OXYCODONE-ACETAMINOPHEN 5-325 MG PO TABS: 1.0000 | ORAL_TABLET | ORAL | Status: DC | PRN

## 2012-05-08 MED ORDER — FERROUS SULFATE 325 (65 FE) MG PO TABS: 325.0000 mg | ORAL_TABLET | Freq: Two times a day (BID) | ORAL | Status: DC

## 2012-05-08 MED ORDER — IBUPROFEN 800 MG PO TABS: 800.0000 mg | ORAL_TABLET | Freq: Three times a day (TID) | ORAL | Status: DC | PRN

## 2012-05-08 NOTE — Clinical Social Work Maternal (Signed)
    Clinical Social Work Department PSYCHOSOCIAL ASSESSMENT - MATERNAL/CHILD 05/08/2012  Patient:  Melanie Neal, Melanie Neal  Account Number:  [id number]  Admit Date:  05/06/2012  Marjo Bicker Name:   Mignon Pine    Clinical Social Worker:  Nobie Putnam, LCSW   Date/Time:  05/08/2012 01:00 PM  Date Referred:  05/08/2012   Referral source  CN     Referred reason  Substance Abuse   Other referral source:    I:  FAMILY / HOME ENVIRONMENT Child's legal guardian:  PARENT  Guardian - Name Guardian - Age Guardian - Address  Melanie Neal 26 [address] [place]; [place] [id number]  Melanie Neal 29    Other household support members/support persons Name Relationship DOB  Melanie Neal SON 07/15/07   Other support:   Melanie Neal, mother    II  PSYCHOSOCIAL DATA Information Source:  Patient Interview  Financial and Community Resources Employment:   Financial resources:  [employer] If Medicaid - County:  [place] Other  Sales executive  WIC  Section 8   School / Grade:   Maternity Care Coordinator / Child Services Coordination / Early Interventions:   Melanie Neal  Cultural issues impacting care:    III  STRENGTHS Strengths  Adequate Resources  Home prepared for Child (including basic supplies)  Supportive family/friends   Strength comment:    IV  RISK FACTORS AND CURRENT PROBLEMS Current Problem:  YES   Risk Factor & Current Problem Patient Issue Family Issue Risk Factor / Current Problem Comment  Substance Abuse Y N Hx of MJ use    V  SOCIAL WORK ASSESSMENT Pt admits to smoking MJ, "daily" prior to pregnancy confirmation at 7 weeks.  Once pregnancy was confirmed, she continued to smoke "a little bit," until she stopped at 3 months.  She denies other illegal substance use and verbalized understanding of hospital drug testing policy. UDS is negative, meconium results are pending.  She denies previous CPS involvement.  Pt has supplies for the infant and appears to be  bonding well.  CSW will continue to monitor drug screen results and make a referral if needed.      VI SOCIAL WORK PLAN Social Work Plan  No Further Intervention Required / No Barriers to Discharge   Type of pt/family education:   If child protective services report - county:   If child protective services report - date:   Information/referral to community resources comment:   Other social work plan:

## 2012-05-08 NOTE — Discharge Summary (Signed)
  Cesarean Section Delivery Discharge Summary  Melanie Neal  DOB:    07-16-1985 MRN:    [id number] CSN:    [id number]  Date of admission:                  05/06/2012  Date of discharge:                   05/08/2012  Procedures this admission:  05/06/2012  Repeat low transverse cesarean section by Dr. Osborn Coho  Newborn Data:  Live born female  Birth Weight: 6 lb 10.9 oz (3030 g) APGAR: 9, 9  Home with mother. Name: [place]  History of Present Illness:  Melanie Neal is a 27 y.o. female, [id number], who presents at [redacted]w[redacted]d weeks gestation. The patient has been followed at the [facility] division of [facility].    Her pregnancy has been complicated by:  Patient Active Problem List  Diagnosis  . Morbid obesity  . 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 Normal IUP (intrauterine pregnancy) on prenatal ultrasound  . Group B streptococcal infection  . Previous cesarean section  . Chlamydia infection, current pregnancy  . Status post repeat low transverse cesarean section    Hospital course:  The patient was admitted for repeat cesarean section.   Her postpartum course was not complicated. She quickly tolerated a regular diet. She was discharged to home on postpartum day 2 doing well.  Feeding:  breast  Contraception:  Mirena IUD  Discharge hemoglobin:  Hemoglobin  Date Value Range Status  05/07/2012 9.3* 12.0 - 15.0 g/dL Final     HCT  Date Value Range Status  05/07/2012 28.5* 36.0 - 46.0 % Final    Discharge Physical Exam:   General: no distress Lochia: appropriate Uterine Fundus: firm Incision: healing well DVT Evaluation: No evidence of DVT seen on physical exam.  Intrapartum Procedures: cesarean: low cervical, transverse Postpartum Procedures: none Complications-Operative and Postpartum: none  Discharge Diagnoses: Term Pregnancy-delivered and Prior cesarean section now with  a repeat cesarean section, obesity, anemia  Discharge Information:  Activity:           pelvic rest Diet:                routine Medications: PNV, Ibuprofen, Iron and Percocet Condition:      stable and improved Instructions:  Cesarean section and postpartum depression instructions given Discharge to: home  Follow-up Information    Follow up with [facility]. In 5 weeks.   Contact information:   [address], [address] [place] [address]           Janine Limbo 05/08/2012

## 2012-06-10 ENCOUNTER — Ambulatory Visit: Payer: Medicaid Other | Admitting: Obstetrics and Gynecology

## 2012-06-10 ENCOUNTER — Encounter: Payer: Self-pay | Admitting: Obstetrics and Gynecology

## 2012-06-10 NOTE — Progress Notes (Signed)
Date of delivery: 05/06/12  Female Name: [place] Vaginal delivery:no Cesarean section:yes Tubal ligation:no GDM:no Breast Feeding:no Bottle Feeding:yes Post-Partum Blues:no Abnormal pap:no Normal GU function: yes Normal GI function:yes Returning to work:yes EPDS: 5 BP 120/76  Ht 5\' 4"  (1.626 m)  Wt 230 lb (104.327 kg)  BMI 39.46 kg/m2  Breastfeeding? No @name  @age . female who presents for a postpartum visit.  ABD: soft nontender GU: vulva normal no masses seen.  Vagina normal in appearance.  Cervix is parous and NT.  Uterus normal size.  No adnexal tenderness bilaterally or fullness EXT: no CCEB IUD pt plans mirena.  Will schedule.  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

## 2012-06-10 NOTE — Patient Instructions (Signed)
Intrauterine Device Information  An intrauterine device (IUD) is inserted into your uterus and prevents pregnancy. There are 2 types of IUDs available:  · Copper IUD. This type of IUD is wrapped in copper wire and is placed inside the uterus. Copper makes the uterus and fallopian tubes produce a fluid that kills sperm. The copper IUD can stay in place for 10 years.  · Hormone IUD. This type of IUD contains the hormone progestin (synthetic progesterone). The hormone thickens the cervical mucus and prevents sperm from entering the uterus, and it also thins the uterine lining to prevent implantation of a fertilized egg. The hormone can weaken or kill the sperm that get into the uterus. The hormone IUD can stay in place for 5 years.  Your caregiver will make sure you are a good candidate for a contraceptive IUD. Discuss with your caregiver the possible side effects.  ADVANTAGES  · It is highly effective, reversible, long-acting, and low maintenance.  · There are no estrogen-related side effects.  · An IUD can be used when breastfeeding.  · It is not associated with weight gain.  · It works immediately after insertion.  · The copper IUD does not interfere with your female hormones.  · The progesterone IUD can make heavy menstrual periods lighter.  · The progesterone IUD can be used for 5 years.  · The copper IUD can be used for 10 years.  DISADVANTAGES  · The progesterone IUD can be associated with irregular bleeding patterns.  · The copper IUD can make your menstrual flow heavier and more painful.  · You may experience cramping and vaginal bleeding after insertion.  Document Released: 02/21/2004 Document Revised: 06/11/2011 Document Reviewed: 07/22/2010  Exit[employer]® Patient Information ©2013 [employer].

## 2014-02-01 ENCOUNTER — Encounter: Payer: Self-pay | Admitting: Obstetrics and Gynecology

## 2014-04-09 IMAGING — US US OB TRANSVAGINAL
1 series · 13 of 28 positions shown · non-contrast
Comparison: none

[Series 1: us ob comp less 14 wks · 13 of 46 slices shown]
[im 2/46]
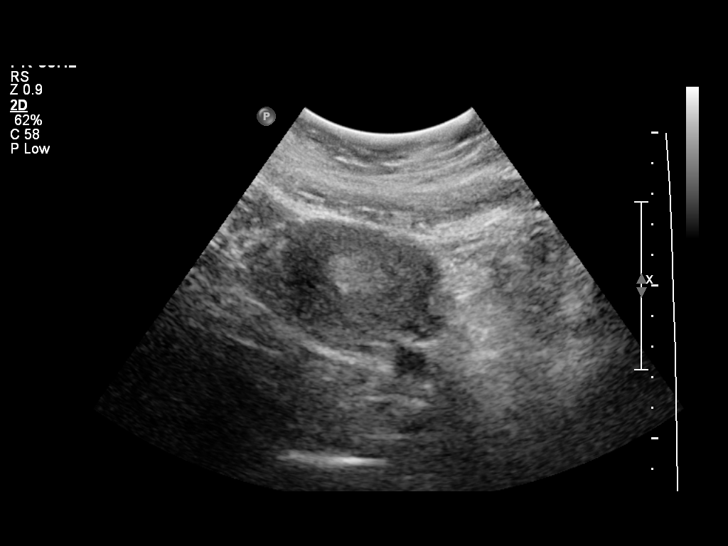
[im 6/46]
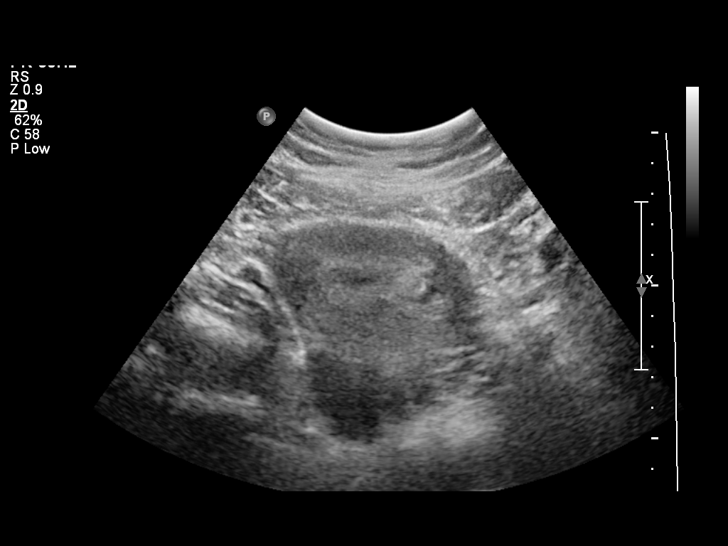
[im 9/46]
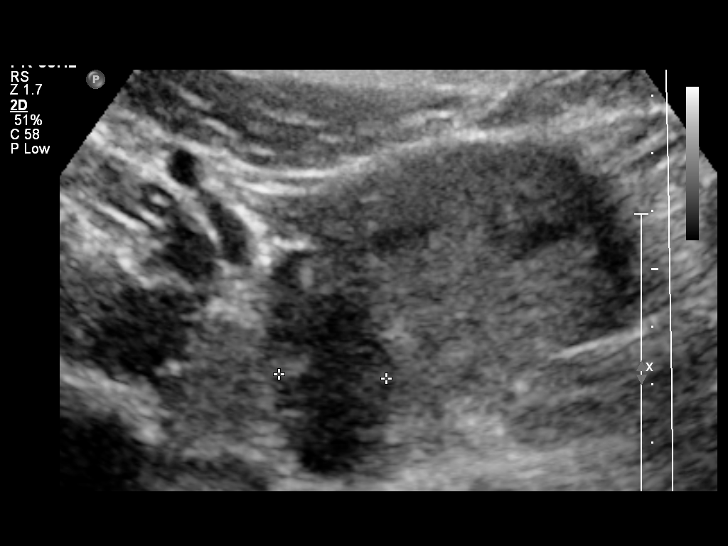
[im 12/46]
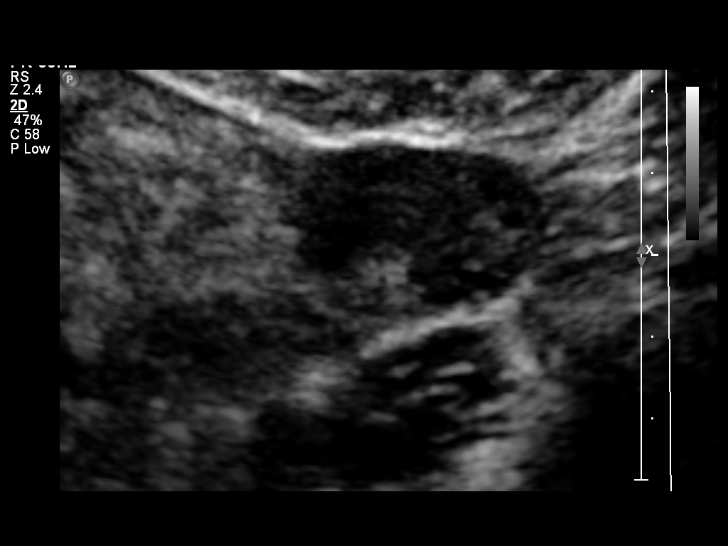
[im 16/46]
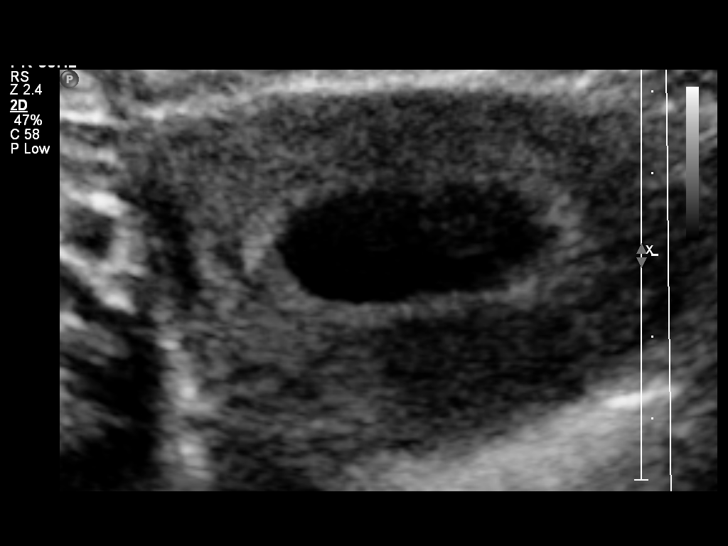
[im 19/46]
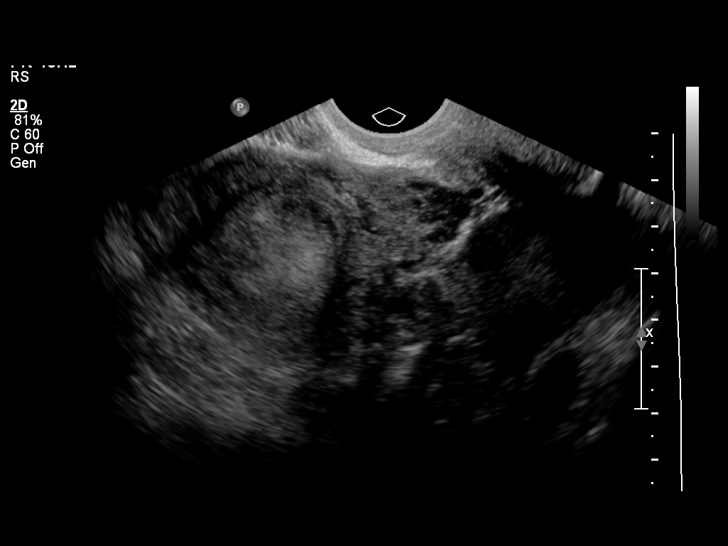
[im 24/46]
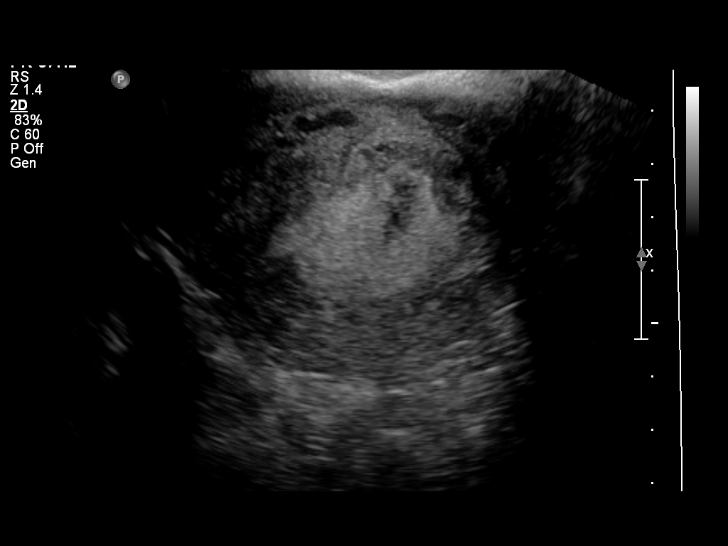
[im 27/46]
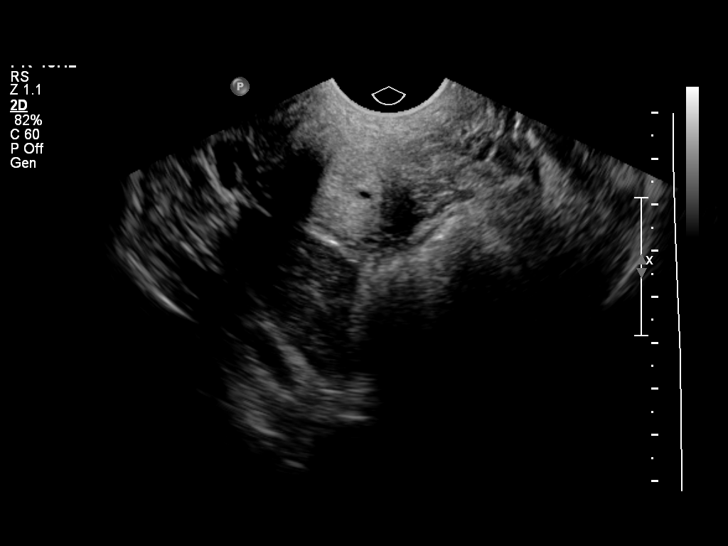
[im 31/46]
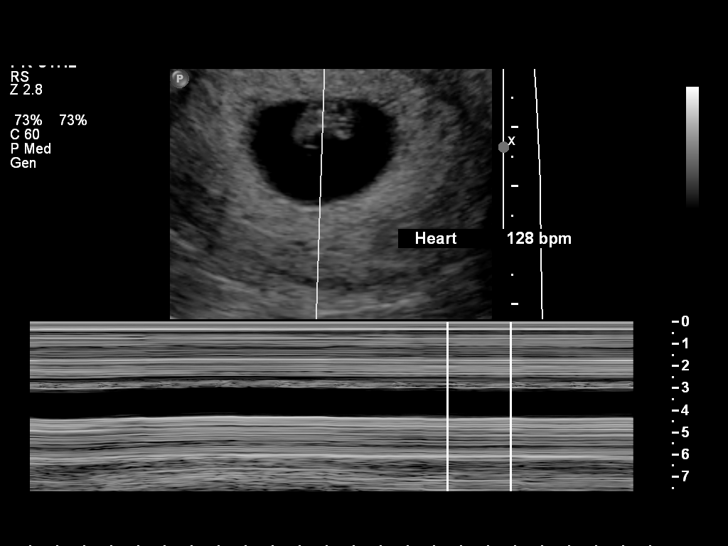
[im 34/46]
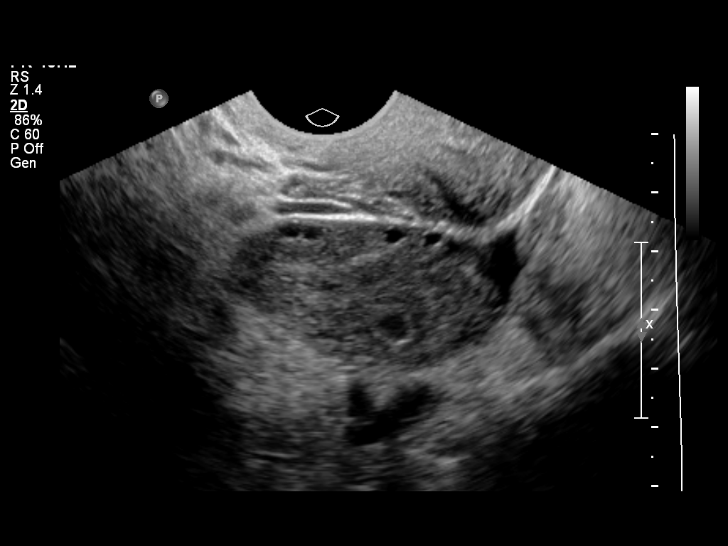
[im 37/46]
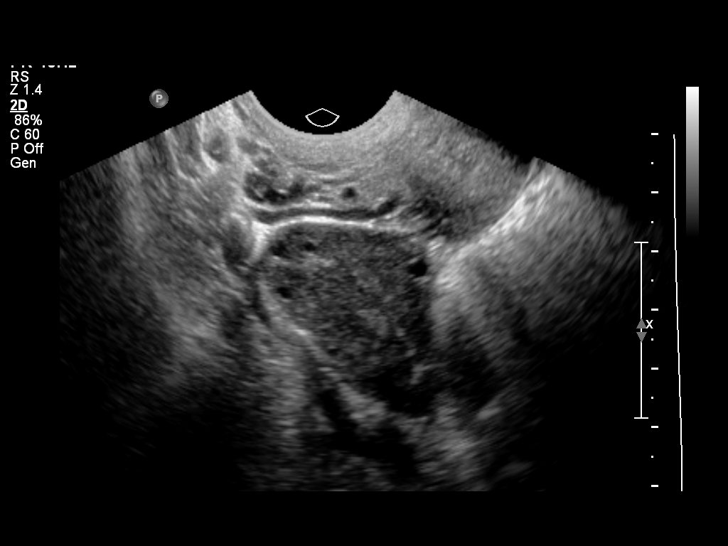
[im 41/46]
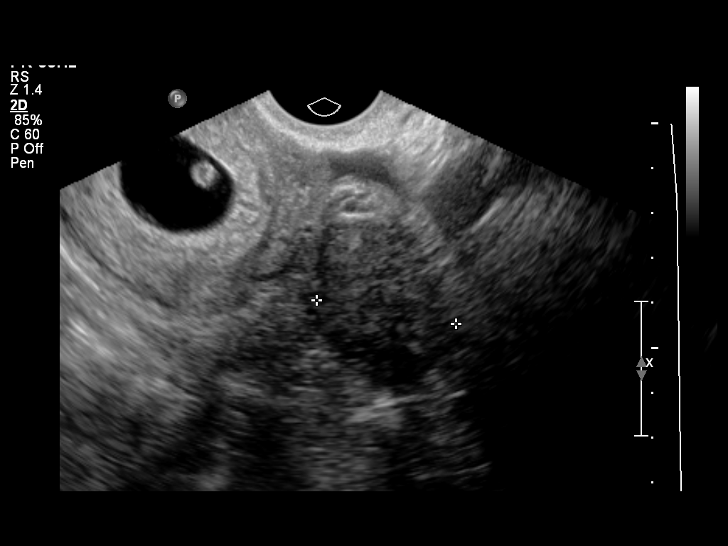
[im 44/46]
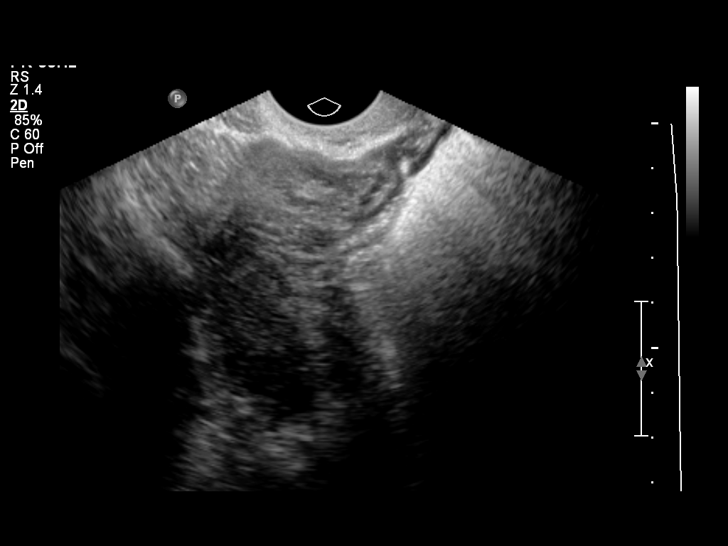

[13 of 28 positions shown; findings below may reference images not displayed]

OBSTETRICS REPORT
                      (Signed Final 09/24/2011 [DATE])

                 CNM
                 32_E
Procedures

 US OB COMP LESS 14 WKS                                76801.0
 US OB TRANSVAGINAL                                    76817.0
Indications

 Abdominal pain - generalized
 Pregnancy with inconclusive fetal viability
Fetal Evaluation

 Preg. Location:    Intrauterine
 Gest. Sac:         Intrauterine
 Yolk Sac:          Visualized
 Fetal Pole:        Visualized
 Fetal Heart Rate:  128                         bpm
 Cardiac Activity:  Observed
Biometry

 CRL:     10.4  mm    G. Age:   7w 1d                  EDD:   05/11/12
Gestational Age

 LMP:           6w 2d        Date:   08/11/11                 EDD:   05/17/12
 Best:          7w 1d     Det. By:   U/S C R L (09/24/11)     EDD:   05/11/12
Cervix Uterus Adnexa

 Cervix:       Closed.
 Uterus:       No abnormality visualized.
 Cul De Sac:   No free fluid seen.

 Left Ovary:   Within normal limits.
 Right Ovary:  Within normal limits.
 Adnexa:     No abnormality visualized.
Impression

 Single living IUP with US Gest. Age of 7w1d.  This is ahead
 of LMP;  recommend assigned US EDD of 05/11/12.
 No significant maternal uterine or adnexal abnormality
 identified.

## 2017-04-28 ENCOUNTER — Ambulatory Visit (HOSPITAL_COMMUNITY)
Admission: EM | Admit: 2017-04-28 | Discharge: 2017-04-28 | Disposition: A | Discharge status: Home / Self Care | Payer: Self-pay | Attending: Family Medicine | Admitting: Family Medicine

## 2017-04-28 ENCOUNTER — Encounter (HOSPITAL_COMMUNITY): Payer: Self-pay | Admitting: Emergency Medicine

## 2017-04-28 DIAGNOSIS — L02411 Cutaneous abscess of right axilla: Secondary | ICD-10-CM

## 2017-04-28 MED ORDER — SULFAMETHOXAZOLE-TRIMETHOPRIM 800-160 MG PO TABS: 1.0000 | ORAL_TABLET | Freq: Two times a day (BID) | ORAL | 0 refills | Status: AC

## 2017-04-28 MED ORDER — HYDROCODONE-ACETAMINOPHEN 5-325 MG PO TABS: 2.0000 | ORAL_TABLET | ORAL | 0 refills | Status: DC | PRN

## 2017-04-28 NOTE — ED Triage Notes (Signed)
PT has a large abscess under her right arm for 3-4 days.

## 2017-04-28 NOTE — ED Provider Notes (Addendum)
[facility]    CSN: [id number] Arrival date & time: 04/28/17  1603     History   Chief Complaint Chief Complaint  Patient presents with  . Abscess    HPI Melanie Neal is a 32 y.o. female.   The history is provided by the patient.  Abscess  Abscess location: Right Axilla. Size:  5 cm Abscess quality: fluctuance, painful and redness   Abscess quality: not draining   Red streaking: no   Duration:  1 week Progression:  Worsening Pain details:    Quality:  Sharp   Severity:  Severe   Duration:  1 week   Timing:  Constant   Progression:  Worsening Chronicity:  New Context: not diabetes and not immunosuppression   Relieved by:  Nothing Worsened by:  Nothing Ineffective treatments:  Warm compresses Associated symptoms: no fever, no headaches, no nausea and no vomiting   Risk factors: prior abscess   Risk factors: no hx of MRSA     Past Medical History:  Diagnosis Date  . Abnormal Pap smear 2009   Was to have f/u;but did not have done;last pap 2009 was not normal  . Anemia    during prev pregnancy;iron supplements taken  . Complication of anesthesia 2009   Anesthesia did not last long;  . Infection    yeast inf; not frequently  . Infection    UTI;not frequently  . No pertinent past medical history     Patient Active Problem List   Diagnosis Date Noted  . Status post repeat low transverse cesarean section 05/06/2012  . Chlamydia infection, current pregnancy 04/14/2012  . Previous cesarean section 11/20/2011  . Group B streptococcal infection 09/28/2011  . Normal IUP (intrauterine pregnancy) on prenatal ultrasound 09/24/2011  . PAP SMER CERV W/HI GRADE SQUAMOUS INTRAEPITH LES 12/21/2008  . Morbid obesity (HCC) 12/08/2008    Past Surgical History:  Procedure Laterality Date  . CESAREAN SECTION    . CESAREAN SECTION  05/06/2012   Procedure: CESAREAN SECTION;  Surgeon: Purcell Nails, MD;  Location: [facility];  Service: Obstetrics;  Laterality:  N/A;    OB History    Gravida Para Term Preterm AB Living   2 2 2     2    SAB TAB Ectopic Multiple Live Births           2       Home Medications    Prior to Admission medications   Medication Sig Start Date End Date Taking? Authorizing Provider  ferrous sulfate (FERROUSUL) 325 (65 FE) MG tablet Take 1 tablet (325 mg total) by mouth 2 (two) times daily with a meal. 05/08/12   Kirkland Hun, MD  HYDROcodone-acetaminophen (NORCO/VICODIN) 5-325 MG tablet Take 2 tablets by mouth every 4 (four) hours as needed. 04/28/17   Lucia Estelle, NP  ibuprofen (ADVIL,MOTRIN) 800 MG tablet Take 1 tablet (800 mg total) by mouth every 8 (eight) hours as needed for pain. 05/08/12   Kirkland Hun, MD  oxyCODONE-acetaminophen (ROXICET) 5-325 MG per tablet Take 1 tablet by mouth every 4 (four) hours as needed for pain. 05/08/12   Kirkland Hun, MD  Prenatal Vit-Fe Sulfate-FA (PRENATAL VITAMIN PO) Take 1 tablet by mouth daily.    [provider]  promethazine (PHENERGAN) 12.5 MG tablet Take 1 tablet (12.5 mg total) by mouth every 6 (six) hours as needed for nausea. 05/08/12   Kirkland Hun, MD  sulfamethoxazole-trimethoprim (BACTRIM DS,SEPTRA DS) 800-160 MG tablet Take 1 tablet by mouth 2 (  two) times daily for 7 days. 04/28/17 05/05/17  Lucia EstelleZheng, Soriah Leeman, NP    Family History Family History  Problem Relation Age of Onset  . Asthma Other        Nephew  . Hypertension Mother   . Other Neg Hx     Social History Social History   Tobacco Use  . Smoking status: Never Smoker  . Smokeless tobacco: Never Used  Substance Use Topics  . Alcohol use: No    Comment: Special occasions only  . Drug use: Yes    Frequency: 7.0 times per week    Types: Marijuana    Comment: No MJ use since pregnancy     Allergies   Patient has no known allergies.   Review of Systems Review of Systems  Constitutional: Negative for fever.       See HPI  Gastrointestinal: Negative for nausea and vomiting.    Neurological: Negative for headaches.     Physical Exam Triage Vital Signs ED Triage Vitals  Enc Vitals Group     BP 04/28/17 1658 (!) 146/88     Pulse Rate 04/28/17 1658 85     Resp 04/28/17 1658 18     Temp 04/28/17 1658 98.5 F (36.9 C)     Temp Source 04/28/17 1658 Oral     SpO2 04/28/17 1658 100 %     Weight 04/28/17 1700 230 lb (104.3 kg)     Height --      Head Circumference --      Peak Flow --      Pain Score 04/28/17 1700 10     Pain Loc --      Pain Edu? --      Excl. in GC? --    No data found.  Updated Vital Signs BP (!) 146/88 (BP Location: Right Arm) Comment: Notified [place]  Pulse 85   Temp 98.5 F (36.9 C) (Oral)   Resp 18   Wt 230 lb (104.3 kg)   SpO2 100%   BMI 39.48 kg/m   Physical Exam  Constitutional: She is oriented to person, place, and time. She appears well-developed and well-nourished. No distress.  Cardiovascular: Normal rate.  Pulmonary/Chest: Effort normal.  Neurological: She is alert and oriented to person, place, and time.  Skin:  Has a large 5 cm abscess over right axilla that is red and fluctuance to palpate.   Nursing note and vitals reviewed.   UC Treatments / Results  Labs (all labs ordered are listed, but only abnormal results are displayed) Labs Reviewed  AEROBIC/ANAEROBIC CULTURE (SURGICAL/DEEP WOUND)    EKG  EKG Interpretation None      Radiology No results found.  Procedures Incision and Drainage Date/Time: 04/28/2017 6:26 PM Performed by: Lucia EstelleZheng, Carlous Olivares, NP Authorized by: Elvina SidleLauenstein, Kurt, MD   Consent:    Consent obtained:  Verbal   Consent given by:  Patient   Risks discussed:  Pain and bleeding   Alternatives discussed:  No treatment Location:    Type:  Abscess   Size:  5 cm   Location:  Upper extremity   Upper extremity location:  Arm   Arm location: Right axilla. Pre-procedure details:    Skin preparation:  Betadine Anesthesia (see MAR for exact dosages):    Anesthesia method:  Local  infiltration   Local anesthetic:  Lidocaine 2% w/o epi Procedure type:    Complexity:  Simple Procedure details:    Needle aspiration: no     Incision types:  Stab  incision   Incision depth:  Dermal   Scalpel blade:  11   Wound management:  Extensive cleaning   Drainage:  Purulent   Drainage amount:  Copious   Wound treatment:  Wound left open   Packing materials:  None Post-procedure details:    Patient tolerance of procedure:  Tolerated well, no immediate complications   (including critical care time)  Medications Ordered in UC Medications - No data to display   Initial Impression / Assessment and Plan / UC Course  I have reviewed the triage vital signs and the nursing notes.  Pertinent labs & imaging results that were available during my care of the patient were reviewed by me and considered in my medical decision making (see chart for details).  Final Clinical Impressions(s) / UC Diagnoses   Final diagnoses:  Abscess of axilla, right   Incision and Drainage performed today: see procedure note above; Patient tolerated well.  Wound culture obtained and pending.  RX for bactrim given RX for Norco Given.  Wound care discussed Return precaution discussed.  Patient denies any questions.   ED Discharge Orders        Ordered    sulfamethoxazole-trimethoprim (BACTRIM DS,SEPTRA DS) 800-160 MG tablet  2 times daily     04/28/17 1741    HYDROcodone-acetaminophen (NORCO/VICODIN) 5-325 MG tablet  Every 4 hours PRN     04/28/17 1741     Controlled Substance Prescriptions [place] Controlled Substance Registry consulted? yes     Lucia Estelle, NP 04/28/17 1828

## 2017-05-03 LAB — AEROBIC CULTURE  (SUPERFICIAL SPECIMEN)

## 2017-05-03 LAB — AEROBIC CULTURE W GRAM STAIN (SUPERFICIAL SPECIMEN)

## 2017-08-11 ENCOUNTER — Other Ambulatory Visit: Payer: Self-pay

## 2017-08-11 ENCOUNTER — Encounter (HOSPITAL_COMMUNITY): Payer: Self-pay | Admitting: *Deleted

## 2017-08-11 ENCOUNTER — Ambulatory Visit (HOSPITAL_COMMUNITY)
Admission: EM | Admit: 2017-08-11 | Discharge: 2017-08-11 | Disposition: A | Discharge status: Home / Self Care | Payer: Medicaid Other | Attending: Physician Assistant | Admitting: Physician Assistant

## 2017-08-11 DIAGNOSIS — L02419 Cutaneous abscess of limb, unspecified: Secondary | ICD-10-CM

## 2017-08-11 DIAGNOSIS — L03119 Cellulitis of unspecified part of limb: Secondary | ICD-10-CM

## 2017-08-11 DIAGNOSIS — L02411 Cutaneous abscess of right axilla: Secondary | ICD-10-CM

## 2017-08-11 HISTORY — DX: Cutaneous abscess, unspecified: L02.91

## 2017-08-11 MED ORDER — LIDOCAINE-EPINEPHRINE (PF) 2 %-1:200000 IJ SOLN
INTRAMUSCULAR | Status: AC
  Filled 2017-08-11: qty 20

## 2017-08-11 MED ORDER — DOXYCYCLINE HYCLATE 100 MG PO CAPS: 100.0000 mg | ORAL_CAPSULE | Freq: Two times a day (BID) | ORAL | 0 refills | Status: AC

## 2017-08-11 NOTE — ED Provider Notes (Signed)
08/11/2017 1:04 PM   DOB: 1985-04-09 / MRN: [id number]  SUBJECTIVE:  Melanie Neal is a 32 y.o. female presenting for pain and swelling of the right armpit.  Patient has been here in the past for a similar problem which required drainage.  She denies fever, chills, nausea, palpitations.  This problem has been worsening over the last week.  She is tried several over-the-counter pain relievers with mild relief of symptoms.  She is allergic to vicodin [hydrocodone-acetaminophen].   She  has a past medical history of Abnormal Pap smear (2009), Abscess, Anemia, Complication of anesthesia (2009), Infection, and Infection.    She  reports that she has never smoked. She has never used smokeless tobacco. She reports that she has current or past drug history. Drug: Marijuana. Frequency: 7.00 times per week. She reports that she does not drink alcohol. She  reports that she does not currently engage in sexual activity but has had partners who are Female. The patient  has a past surgical history that includes Cesarean section and Cesarean section (05/06/2012).  Her family history includes Asthma in her other; Hypertension in her mother.  ROS as per HPI  OBJECTIVE:  BP 124/76 (BP Location: Left Arm)   Pulse 92   Temp 98.5 F (36.9 C) (Oral)   Resp 20   SpO2 99%   Physical Exam  Constitutional: She is oriented to person, place, and time. She appears well-nourished. No distress.  Eyes: Pupils are equal, round, and reactive to light. EOM are normal.  Cardiovascular: Normal rate.  Pulmonary/Chest: Effort normal.    Abdominal: She exhibits no distension.  Neurological: She is alert and oriented to person, place, and time. No cranial nerve deficit. Gait normal.  Skin: Skin is dry. She is not diaphoretic.  Psychiatric: She has a normal mood and affect.  Vitals reviewed.  Risk and benefits discussed and verbal consent obtained. Anesthetic allergies reviewed. Patient anesthetized using 1:1 mix of 2%  lidocaine with epi. A 1 cm incision was made using a number 11 blade and copious purulent material was expressed.  The was wound packed. The patient tolerated the procedure without difficulty.   A clean dressing was placed and wound care instructions were provided.    No results found for this or any previous visit (from the past 72 hour(s)).  No results found.  ASSESSMENT AND PLAN:  Orders Placed This Encounter  Procedures  . Apply dressing    Standing Status:   Standing    Number of Occurrences:   1     Cellulitis and abscess of upper extremity: Wound dressed.  Patient tolerated the procedure well and hemostasis achieved upon leaving the clinic.  Given the size of the abscess feel is best that she come back tomorrow for reevaluation and repacking if necessary.  Patient started on doxycycline here.      The patient is advised to call or return to clinic if she does not see an improvement in symptoms, or to seek the care of the closest emergency department if she worsens with the above plan.   Deliah Boston, MHS, PA-C 08/11/2017 1:04 PM    Ofilia Neas, PA-C 08/11/17 1305

## 2017-08-11 NOTE — Discharge Instructions (Addendum)
Come back tomorrow for dressing change and wound eval.  Apply warm compress to the wound often. Start the antibiotic ASAP.  Take 600-800 mg of ibuprofen every 8 hours for pain.  Go to the ED if sudden fever greater than 100.4, sweats, nausea, dizziness.

## 2017-08-11 NOTE — ED Triage Notes (Signed)
C/O right axillary abscess x 2 wks; was treated for same in Jan - states has difficulty swallowing pills.  No fevers.

## 2017-08-12 ENCOUNTER — Encounter (HOSPITAL_COMMUNITY): Payer: Self-pay | Admitting: Emergency Medicine

## 2017-08-12 ENCOUNTER — Ambulatory Visit (HOSPITAL_COMMUNITY)
Admission: EM | Admit: 2017-08-12 | Discharge: 2017-08-12 | Disposition: A | Discharge status: Home / Self Care | Payer: Self-pay | Attending: Family Medicine | Admitting: Family Medicine

## 2017-08-12 ENCOUNTER — Other Ambulatory Visit: Payer: Self-pay

## 2017-08-12 DIAGNOSIS — L02411 Cutaneous abscess of right axilla: Secondary | ICD-10-CM

## 2017-08-12 DIAGNOSIS — L03111 Cellulitis of right axilla: Secondary | ICD-10-CM

## 2017-08-12 NOTE — ED Triage Notes (Signed)
The patient presented to the [facility] with a complaint of a follow up on an abscess that was I&D yesterday. She was advised to return to have the packing changed.

## 2017-08-12 NOTE — ED Provider Notes (Signed)
[facility]   [id number] 08/12/17 Arrival Time: 1028  SUBJECTIVE:  Melanie Neal is a 32 y.o. female who presents with a skin complaint.  Patient presents today for packing removal following incision and drainage of abscess from right axilla.  Was also diagnosed with cellulitis and prescribed doxycycline.    ROS: As per HPI.  Past Medical History:  Diagnosis Date  . Abnormal Pap smear 2009   Was to have f/u;but did not have done;last pap 2009 was not normal  . Abscess   . Anemia    during prev pregnancy;iron supplements taken  . Complication of anesthesia 2009   Anesthesia did not last long;  . Infection    yeast inf; not frequently  . Infection    UTI;not frequently   Past Surgical History:  Procedure Laterality Date  . CESAREAN SECTION    . CESAREAN SECTION  05/06/2012   Procedure: CESAREAN SECTION;  Surgeon: Purcell Nails, MD;  Location: [facility];  Service: Obstetrics;  Laterality: N/A;   Allergies  Allergen Reactions  . Vicodin [Hydrocodone-Acetaminophen] Itching   No current facility-administered medications on file prior to encounter.    Current Outpatient Medications on File Prior to Encounter  Medication Sig Dispense Refill  . doxycycline (VIBRAMYCIN) 100 MG capsule Take 1 capsule (100 mg total) by mouth 2 (two) times daily for 10 days. 20 capsule 0    Social History   Socioeconomic History  . Marital status: Single    Spouse name: Not on file  . Number of children: 1  . Years of education: 45  . Highest education level: Not on file  Occupational History  . Occupation: Consulting civil engineer  Social Needs  . Financial resource strain: Not on file  . Food insecurity:    Worry: Not on file    Inability: Not on file  . Transportation needs:    Medical: Not on file    Non-medical: Not on file  Tobacco Use  . Smoking status: Never Smoker  . Smokeless tobacco: Never Used  Substance and Sexual Activity  . Alcohol use: No  . Drug use: Yes   Frequency: 7.0 times per week    Types: Marijuana    Comment: occasionally  . Sexual activity: Not Currently    Partners: Male  Lifestyle  . Physical activity:    Days per week: Not on file    Minutes per session: Not on file  . Stress: Not on file  Relationships  . Social connections:    Talks on phone: Not on file    Gets together: Not on file    Attends religious service: Not on file    Active member of club or organization: Not on file    Attends meetings of clubs or organizations: Not on file    Relationship status: Not on file  . Intimate partner violence:    Fear of current or ex partner: Not on file    Emotionally abused: Not on file    Physically abused: Not on file    Forced sexual activity: Not on file  Other Topics Concern  . Not on file  Social History Narrative  . Not on file   Family History  Problem Relation Age of Onset  . Asthma Other        Nephew  . Hypertension Mother   . Other Neg Hx     OBJECTIVE: Vitals:   08/12/17 1106  BP: 123/69  Pulse: 90  Resp: 20  Temp: 98.2  F (36.8 C)  TempSrc: Oral  SpO2: 100%    General appearance: alert; no distress Extremities: no edema Skin: warm and dry; dressing and packing removed from right axilla.  Scant purulent and serosanguinous discharge appreciated.  Mildly tender to palpation.   Psychological: alert and cooperative; normal mood and affect  ASSESSMENT & PLAN:  1. Abscess of right axilla   2. Cellulitis of right axilla     No orders of the defined types were placed in this encounter.  Offered to repack, but patient declines Dressing applied  Patient instructed to wash area daily with mild soap and water Do not submerge in water Change dressing daily Take antibiotics as prescribed and to completion Return or go to ER if you have any new or worsening symptoms  Reviewed expectations re: course of current medical issues. Questions answered. Outlined signs and symptoms indicating need for  more acute intervention. Patient verbalized understanding. After Visit Summary given.   Rennis Harding, PA-C 08/12/17 1153

## 2017-08-12 NOTE — Discharge Instructions (Addendum)
Wash area daily with mild soap and water Do not submerge in water Change dressing daily Take antibiotics as prescribed and to completion Return or go to ER if you have any new or worsening symptoms

## 2017-09-30 ENCOUNTER — Encounter (HOSPITAL_COMMUNITY): Payer: Self-pay | Admitting: Emergency Medicine

## 2017-09-30 ENCOUNTER — Ambulatory Visit (HOSPITAL_COMMUNITY)
Admission: EM | Admit: 2017-09-30 | Discharge: 2017-09-30 | Disposition: A | Discharge status: Home / Self Care | Payer: Medicaid Other | Attending: Family Medicine | Admitting: Family Medicine

## 2017-09-30 DIAGNOSIS — L02411 Cutaneous abscess of right axilla: Secondary | ICD-10-CM

## 2017-09-30 MED ORDER — TRAMADOL HCL 50 MG PO TABS: 50.0000 mg | ORAL_TABLET | Freq: Four times a day (QID) | ORAL | 0 refills | Status: DC | PRN

## 2017-09-30 MED ORDER — DOXYCYCLINE HYCLATE 100 MG PO CAPS: 100.0000 mg | ORAL_CAPSULE | Freq: Two times a day (BID) | ORAL | 0 refills | Status: AC

## 2017-09-30 MED ORDER — IBUPROFEN 800 MG PO TABS
800.0000 mg | ORAL_TABLET | Freq: Three times a day (TID) | ORAL | 0 refills | Status: DC
Start: 2017-09-30 — End: 2018-06-19

## 2017-09-30 MED ORDER — LIDOCAINE-EPINEPHRINE 2 %-1:200000 IJ SOLN
INTRAMUSCULAR | Status: AC
Start: 2017-09-30 — End: ?
  Filled 2017-09-30: qty 20

## 2017-09-30 NOTE — Discharge Instructions (Addendum)
Keep this dressing in place for the next 24 hours.  Warm compresses to promote drainage.  May remove pack in 3 days.  Try to keep that incision open to promote further drainage. Complete course of antibiotics.  After dressing removed wash daily with soap and water. Ibuprofen for pain, I have sent a few tramadol for breakthrough pain, this may cause drowsiness. Please follow up/establish with dermatology for persistent abscesses.

## 2017-09-30 NOTE — ED Provider Notes (Signed)
[facility]    CSN: [id number] Arrival date & time: 09/30/17  1031     History   Chief Complaint Chief Complaint  Patient presents with  . Abscess    HPI Melanie Neal is a 32 y.o. female.   Melanie Neal presents with complaints of abscess to right axilla. Has had in the same location three times now with two previous I&D's. Last was in may, completed course of antibiotics. Had improved until last week when increased in size again. Painful. No drainage. No fevers. Hasn't been shaving under arms any more. No fevers. Does not have a PCP. States her father had similar. Hx of anemia, UTI's.    ROS per HPI.      Past Medical History:  Diagnosis Date  . Abnormal Pap smear 2009   Was to have f/u;but did not have done;last pap 2009 was not normal  . Abscess   . Anemia    during prev pregnancy;iron supplements taken  . Complication of anesthesia 2009   Anesthesia did not last long;  . Infection    yeast inf; not frequently  . Infection    UTI;not frequently    Patient Active Problem List   Diagnosis Date Noted  . Status post repeat low transverse cesarean section 05/06/2012  . Chlamydia infection, current pregnancy 04/14/2012  . Previous cesarean section 11/20/2011  . Group B streptococcal infection 09/28/2011  . Normal IUP (intrauterine pregnancy) on prenatal ultrasound 09/24/2011  . PAP SMER CERV W/HI GRADE SQUAMOUS INTRAEPITH LES 12/21/2008  . Morbid obesity (HCC) 12/08/2008    Past Surgical History:  Procedure Laterality Date  . CESAREAN SECTION    . CESAREAN SECTION  05/06/2012   Procedure: CESAREAN SECTION;  Surgeon: Purcell NailsAngela Y Roberts, MD;  Location: [facility];  Service: Obstetrics;  Laterality: N/A;    OB History    Gravida  2   Para  2   Term  2   Preterm      AB      Living  2     SAB      TAB      Ectopic      Multiple      Live Births  2            Home Medications    Prior to Admission medications   Medication Sig  Start Date End Date Taking? Authorizing Provider  doxycycline (VIBRAMYCIN) 100 MG capsule Take 1 capsule (100 mg total) by mouth 2 (two) times daily for 10 days. 09/30/17 10/10/17  Georgetta HaberBurky, Natalie B, NP  ibuprofen (ADVIL,MOTRIN) 800 MG tablet Take 1 tablet (800 mg total) by mouth 3 (three) times daily. 09/30/17   Georgetta HaberBurky, Natalie B, NP  traMADol (ULTRAM) 50 MG tablet Take 1 tablet (50 mg total) by mouth every 6 (six) hours as needed. 09/30/17   Georgetta HaberBurky, Natalie B, NP    Family History Family History  Problem Relation Age of Onset  . Asthma Other        Nephew  . Hypertension Mother   . Other Neg Hx     Social History Social History   Tobacco Use  . Smoking status: Never Smoker  . Smokeless tobacco: Never Used  Substance Use Topics  . Alcohol use: No  . Drug use: Yes    Frequency: 7.0 times per week    Types: Marijuana    Comment: occasionally     Allergies   Vicodin [hydrocodone-acetaminophen]   Review of Systems Review of  Systems   Physical Exam Triage Vital Signs ED Triage Vitals [09/30/17 1107]  Enc Vitals Group     BP 130/84     Pulse Rate 71     Resp 18     Temp 98.1 F (36.7 C)     Temp Source Oral     SpO2 96 %     Weight      Height      Head Circumference      Peak Flow      Pain Score      Pain Loc      Pain Edu?      Excl. in GC?    No data found.  Updated Vital Signs BP 130/84 (BP Location: Left Arm)   Pulse 71   Temp 98.1 F (36.7 C) (Oral)   Resp 18   SpO2 96%   Visual Acuity Right Eye Distance:   Left Eye Distance:   Bilateral Distance:    Right Eye Near:   Left Eye Near:    Bilateral Near:     Physical Exam  Constitutional: She is oriented to person, place, and time. She appears well-developed and well-nourished. No distress.  Cardiovascular: Normal rate, regular rhythm and normal heart sounds.  Pulmonary/Chest: Effort normal and breath sounds normal.  Neurological: She is alert and oriented to person, place, and time.  Skin: Skin  is warm and dry.  Right axilla with area of approximately 7 cm of tender and swelling; two areas of fluctuance and mildly firm, scar from previous I&d noted to same area of fluctuance     UC Treatments / Results  Labs (all labs ordered are listed, but only abnormal results are displayed) Labs Reviewed - No data to display  EKG None  Radiology No results found.  Procedures Incision and Drainage Date/Time: 09/30/2017 11:42 AM Performed by: Georgetta Haber, NP Authorized by: [name], MD   Consent:    Consent obtained:  Verbal   Consent given by:  Patient   Risks discussed:  Incomplete drainage, pain, infection and bleeding   Alternatives discussed:  Alternative treatment, observation, referral and no treatment Location:    Type:  Abscess   Size:  6 cm    Location:  Upper extremity   Upper extremity location:  Arm   Arm location:  R upper arm (axilla ) Pre-procedure details:    Skin preparation:  Betadine Anesthesia (see MAR for exact dosages):    Anesthesia method:  Local infiltration   Local anesthetic:  Lidocaine 2% WITH epi Procedure type:    Complexity:  Simple Procedure details:    Incision types:  Single straight (two single straight incisions )   Scalpel blade:  11   Wound management:  Probed and deloculated   Drainage:  Bloody and purulent   Drainage amount:  Copious   Wound treatment:  Drain placed   Packing materials:  1/4 in gauze Post-procedure details:    Patient tolerance of procedure:  Tolerated well, no immediate complications Comments:     Incision x2; medial incision with copious output; more lateral incision only with bloody out; drain placed to medial incision; pressure dressing applied.    (including critical care time)  Medications Ordered in UC Medications - No data to display  Initial Impression / Assessment and Plan / UC Course  I have reviewed the triage vital signs and the nursing notes.  Pertinent labs & imaging results that  were available during my care of the patient  were reviewed by me and considered in my medical decision making (see chart for details).     Tried incising both areas of fluctuance, second area without any purulent discharge unfortunately. Packing to deep abscess. Wound care discussed. Pull packing in three days. Course of antibiotics initiated. Encouraged establish with PCP and/or dermatology as this is recurrent for patient. Patient verbalized understanding and agreeable to plan.    Final Clinical Impressions(s) / UC Diagnoses   Final diagnoses:  Abscess of axilla, right     Discharge Instructions     Keep this dressing in place for the next 24 hours.  Warm compresses to promote drainage.  May remove pack in 3 days.  Try to keep that incision open to promote further drainage. Complete course of antibiotics.  After dressing removed wash daily with soap and water. Ibuprofen for pain, I have sent a few tramadol for breakthrough pain, this may cause drowsiness. Please follow up/establish with dermatology for persistent abscesses.    ED Prescriptions    Medication Sig Dispense Auth. Provider   doxycycline (VIBRAMYCIN) 100 MG capsule Take 1 capsule (100 mg total) by mouth 2 (two) times daily for 10 days. 20 capsule Linus Mako B, NP   ibuprofen (ADVIL,MOTRIN) 800 MG tablet Take 1 tablet (800 mg total) by mouth 3 (three) times daily. 21 tablet Linus Mako B, NP   traMADol (ULTRAM) 50 MG tablet Take 1 tablet (50 mg total) by mouth every 6 (six) hours as needed. 5 tablet Georgetta Haber, NP     Controlled Substance Prescriptions [place] Controlled Substance Registry consulted? Not Applicable   Georgetta Haber, NP 09/30/17 1144

## 2017-09-30 NOTE — ED Triage Notes (Signed)
Pt here for abscess under right axillary area; pt sts hx of same

## 2018-06-19 ENCOUNTER — Ambulatory Visit (HOSPITAL_COMMUNITY)
Admission: EM | Admit: 2018-06-19 | Discharge: 2018-06-19 | Disposition: A | Discharge status: Home / Self Care | Payer: Medicaid Other | Attending: Family Medicine | Admitting: Family Medicine

## 2018-06-19 ENCOUNTER — Other Ambulatory Visit: Payer: Self-pay

## 2018-06-19 ENCOUNTER — Encounter (HOSPITAL_COMMUNITY): Payer: Self-pay

## 2018-06-19 DIAGNOSIS — N764 Abscess of vulva: Secondary | ICD-10-CM

## 2018-06-19 DIAGNOSIS — R102 Pelvic and perineal pain: Secondary | ICD-10-CM

## 2018-06-19 DIAGNOSIS — L0291 Cutaneous abscess, unspecified: Secondary | ICD-10-CM

## 2018-06-19 MED ORDER — DOXYCYCLINE HYCLATE 100 MG PO CAPS: 100.0000 mg | ORAL_CAPSULE | Freq: Two times a day (BID) | ORAL | 0 refills | Status: DC

## 2018-06-19 MED ORDER — LIDOCAINE-EPINEPHRINE (PF) 2 %-1:200000 IJ SOLN
INTRAMUSCULAR | Status: AC
  Filled 2018-06-19: qty 20

## 2018-06-19 MED ORDER — IBUPROFEN 800 MG PO TABS: 800.0000 mg | ORAL_TABLET | Freq: Three times a day (TID) | ORAL | 0 refills | Status: DC

## 2018-06-19 NOTE — ED Triage Notes (Signed)
Pt cc she has a boil in her vaginal area x 3 days.

## 2018-06-19 NOTE — Discharge Instructions (Signed)
We lanced the abscess here in clinic today Doxycycline for antibiotic coverage You can take 800 mg of ibuprofen every 8 hours as needed Follow up as needed for continued or worsening symptoms

## 2018-06-19 NOTE — ED Provider Notes (Signed)
[facility]    CSN: [id number] Arrival date & time: 06/19/18  1131     History   Chief Complaint Chief Complaint  Patient presents with  . Abscess    HPI Melanie Neal is a 33 y.o. female.   Patient is a 33 year old female who presents with abscess to vaginal area x3 days.  Symptoms have been constant and worsening.  She is having more discomfort in that area.  Denies any drainage from the area.  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  Denies any history of MRSA.  She has been doing warm compresses to the area without any relief of symptoms.  Denies any associated fevers, chills, body aches.  ROS per HPI      Past Medical History:  Diagnosis Date  . Abnormal Pap smear 2009   Was to have f/u;but did not have done;last pap 2009 was not normal  . Abscess   . Anemia    during prev pregnancy;iron supplements taken  . Complication of anesthesia 2009   Anesthesia did not last long;  . Infection    yeast inf; not frequently  . Infection    UTI;not frequently    Patient Active Problem List   Diagnosis Date Noted  . Status post repeat low transverse cesarean section 05/06/2012  . Chlamydia infection, current pregnancy 04/14/2012  . Previous cesarean section 11/20/2011  . Group B streptococcal infection 09/28/2011  . Normal IUP (intrauterine pregnancy) on prenatal ultrasound 09/24/2011  . PAP SMER CERV W/HI GRADE SQUAMOUS INTRAEPITH LES 12/21/2008  . Morbid obesity (HCC) 12/08/2008    Past Surgical History:  Procedure Laterality Date  . CESAREAN SECTION    . CESAREAN SECTION  05/06/2012   Procedure: CESAREAN SECTION;  Surgeon: Purcell Nails, MD;  Location: [facility];  Service: Obstetrics;  Laterality: N/A;    OB History    Gravida  2   Para  2   Term  2   Preterm      AB      Living  2     SAB      TAB      Ectopic      Multiple      Live Births  2            Home Medications    Prior to Admission medications    Medication Sig Start Date End Date Taking? Authorizing Provider  doxycycline (VIBRAMYCIN) 100 MG capsule Take 1 capsule (100 mg total) by mouth 2 (two) times daily. 06/19/18   Dahlia Byes A, NP  ibuprofen (ADVIL,MOTRIN) 800 MG tablet Take 1 tablet (800 mg total) by mouth 3 (three) times daily. 06/19/18   Dahlia Byes A, NP  traMADol (ULTRAM) 50 MG tablet Take 1 tablet (50 mg total) by mouth every 6 (six) hours as needed. 09/30/17   Georgetta Haber, NP    Family History Family History  Problem Relation Age of Onset  . Asthma Other        Nephew  . Hypertension Mother   . Other Neg Hx     Social History Social History   Tobacco Use  . Smoking status: Never Smoker  . Smokeless tobacco: Never Used  Substance Use Topics  . Alcohol use: No  . Drug use: Yes    Frequency: 7.0 times per week    Types: Marijuana    Comment: occasionally     Allergies   Vicodin [hydrocodone-acetaminophen]   Review of Systems  Review of Systems   Physical Exam Triage Vital Signs ED Triage Vitals  Enc Vitals Group     BP 06/19/18 1137 133/86     Pulse Rate 06/19/18 1137 86     Resp 06/19/18 1137 18     Temp 06/19/18 1137 98.7 F (37.1 C)     Temp Source 06/19/18 1137 Tympanic     SpO2 06/19/18 1137 100 %     Weight 06/19/18 1141 215 lb (97.5 kg)     Height --      Head Circumference --      Peak Flow --      Pain Score 06/19/18 1141 10     Pain Loc --      Pain Edu? --      Excl. in GC? --    No data found.  Updated Vital Signs BP 133/86 (BP Location: Right Arm)   Pulse 86   Temp 98.7 F (37.1 C) (Tympanic)   Resp 18   Wt 215 lb (97.5 kg)   SpO2 100%   BMI 36.90 kg/m   Visual Acuity Right Eye Distance:   Left Eye Distance:   Bilateral Distance:    Right Eye Near:   Left Eye Near:    Bilateral Near:     Physical Exam Vitals signs and nursing note reviewed.  Constitutional:      General: She is not in acute distress.    Appearance: Normal appearance. She is normal  weight. She is not ill-appearing, toxic-appearing or diaphoretic.  HENT:     Head: Normocephalic and atraumatic.     Nose: Nose normal.  Eyes:     Conjunctiva/sclera: Conjunctivae normal.  Neck:     Musculoskeletal: Normal range of motion.  Pulmonary:     Effort: Pulmonary effort is normal.  Genitourinary:    Comments: Approximated 3 x 4 cm abscess to the right labia majora. Mild erythema, centered fluctuance and surrounding induration.  Musculoskeletal: Normal range of motion.  Skin:    General: Skin is warm and dry.  Neurological:     Mental Status: She is alert.  Psychiatric:        Mood and Affect: Mood normal.      UC Treatments / Results  Labs (all labs ordered are listed, but only abnormal results are displayed) Labs Reviewed - No data to display  EKG None  Radiology No results found.  Procedures Incision and Drainage Date/Time: 06/19/2018 3:35 PM Performed by: Janace Aris, NP Authorized by: [name], MD   Consent:    Consent obtained:  Verbal   Consent given by:  Patient   Risks discussed:  Bleeding, incomplete drainage, pain and damage to other organs   Alternatives discussed:  No treatment Universal protocol:    Patient identity confirmed:  Verbally with patient Location:    Type:  Abscess   Location:  Anogenital   Anogenital location:  [place] Pre-procedure details:    Skin preparation:  Betadine Anesthesia (see MAR for exact dosages):    Anesthesia method:  Local infiltration   Local anesthetic:  Lidocaine 2% w/o epi Procedure type:    Complexity:  Simple Procedure details:    Incision types:  Single straight   Incision depth:  Subcutaneous   Scalpel blade:  11   Wound management:  Probed and deloculated   Drainage:  Purulent and bloody   Drainage amount:  Moderate   Wound treatment:  Wound left open Post-procedure details:    Patient tolerance of  procedure:  Tolerated well, no immediate complications   (including critical care  time)  Medications Ordered in UC Medications - No data to display  Initial Impression / Assessment and Plan / UC Course  I have reviewed the triage vital signs and the nursing notes.  Pertinent labs & imaging results that were available during my care of the patient were reviewed by me and considered in my medical decision making (see chart for details).     I&D abscess here in the clinic Pt tolerated well. A lot of relief after the procedure.  Antibiotic coverage with doxycycline twice a day for 10 days. She can take 100 mg of ibuprofen every 8 hours as needed Follow up as needed for continued or worsening symptoms  Final Clinical Impressions(s) / UC Diagnoses   Final diagnoses:  Abscess     Discharge Instructions     We lanced the abscess here in clinic today Doxycycline for antibiotic coverage You can take 800 mg of ibuprofen every 8 hours as needed Follow up as needed for continued or worsening symptoms    ED Prescriptions    Medication Sig Dispense Auth. Provider   doxycycline (VIBRAMYCIN) 100 MG capsule Take 1 capsule (100 mg total) by mouth 2 (two) times daily. 20 capsule Kaytelyn Glore A, NP   ibuprofen (ADVIL,MOTRIN) 800 MG tablet Take 1 tablet (800 mg total) by mouth 3 (three) times daily. 21 tablet Dahlia Byes A, NP     Controlled Substance Prescriptions  Controlled Substance Registry consulted? Not Applicable   Janace Aris, NP 06/19/18 1536

## 2019-03-03 ENCOUNTER — Encounter (HOSPITAL_COMMUNITY): Payer: Self-pay

## 2019-03-03 ENCOUNTER — Ambulatory Visit (HOSPITAL_COMMUNITY)
Admission: EM | Admit: 2019-03-03 | Discharge: 2019-03-03 | Disposition: A | Discharge status: Home / Self Care | Payer: Medicaid Other | Attending: Family Medicine | Admitting: Family Medicine

## 2019-03-03 ENCOUNTER — Other Ambulatory Visit: Payer: Self-pay

## 2019-03-03 DIAGNOSIS — L02411 Cutaneous abscess of right axilla: Secondary | ICD-10-CM

## 2019-03-03 MED ORDER — LIDOCAINE HCL 2 % IJ SOLN
INTRAMUSCULAR | Status: AC
  Filled 2019-03-03: qty 20

## 2019-03-03 MED ORDER — DOXYCYCLINE HYCLATE 100 MG PO CAPS: 100.0000 mg | ORAL_CAPSULE | Freq: Two times a day (BID) | ORAL | 0 refills | Status: DC

## 2019-03-03 NOTE — ED Provider Notes (Signed)
[place]   [id number] 03/03/19 Arrival Time: 8756  ASSESSMENT & PLAN:  1. Abscess of axilla, right     Incision and Drainage Procedure Note  Anesthesia: 2% plain lidocaine  Procedure Details  The procedure, risks and complications have been discussed in detail (including, but not limited to pain and bleeding) with the patient.  The skin induration was prepped and draped in the usual fashion. After adequate local anesthesia, I&D with a #11 blade was performed on the right axilla with copious purulent drainage.  EBL: minimal Drains: none Packing: iodoform Condition: Tolerated procedure well Complications: none.   Meds ordered this encounter  Medications  . doxycycline (VIBRAMYCIN) 100 MG capsule    Sig: Take 1 capsule (100 mg total) by mouth 2 (two) times daily.    Dispense:  20 capsule    Refill:  0    Wound care instructions discussed and given in written format. To return in 48 hours for wound check. Work note provided for today. Instructed to avoid antiperspirants. Finish all antibiotics. OTC analgesics as needed.  Recommend: Follow-up Information    Sur[facility], [place].   Specialty: General Surgery Why: To discuss recurrent infections of your armpit. Contact information: [address] [place]  [address] [phone]        [facility].   Specialty: Urgent Care Why: In two days for packing removal and wound check. Contact information: [place] [phone]          Reviewed expectations re: course of current medical issues. Questions answered. Outlined signs and symptoms indicating need for more acute intervention. Patient verbalized understanding. After Visit Summary given.   SUBJECTIVE:  Melanie Neal is a 33 y.o. female who presents with a possible infection of her R axilla; similar in the past requiring I&D. Onset gradual,  approximately 5 days ago without active drainage and without active bleeding. Symptoms have gradually worsened since beginning; area more painful now. Fever: absent. OTC/home treatment: none.  ROS: As per HPI.  OBJECTIVE:  Vitals:   03/03/19 0939  BP: 128/86  Pulse: 94  Resp: 15  Temp: 98.8 F (37.1 C)  TempSrc: Oral  SpO2: 100%     General appearance: alert; no distress Neck: no swelling; no LAD Skin: approx 5-6 cm induration of her R axilla; tender to touch; no active drainage or bleeding RUE: normal ROM; normal radial pulse; normal distal sensation Psychological: alert and cooperative; normal mood and affect  Allergies  Allergen Reactions  . Vicodin [Hydrocodone-Acetaminophen] Itching    Past Medical History:  Diagnosis Date  . Abnormal Pap smear 2009   Was to have f/u;but did not have done;last pap 2009 was not normal  . Abscess   . Anemia    during prev pregnancy;iron supplements taken  . Complication of anesthesia 2009   Anesthesia did not last long;  . Infection    yeast inf; not frequently  . Infection    UTI;not frequently   Social History   Socioeconomic History  . Marital status: Single    Spouse name: Not on file  . Number of children: 1  . Years of education: 52  . Highest education level: Not on file  Occupational History  . Occupation: Ship broker  Social Needs  . Financial resource strain: Not on file  . Food insecurity    Worry: Not on file    Inability: Not on file  . Transportation needs  Medical: Not on file    Non-medical: Not on file  Tobacco Use  . Smoking status: Never Smoker  . Smokeless tobacco: Never Used  Substance and Sexual Activity  . Alcohol use: No  . Drug use: Yes    Frequency: 7.0 times per week    Types: Marijuana    Comment: occasionally  . Sexual activity: Not Currently    Partners: Male  Lifestyle  . Physical activity    Days per week: Not on file    Minutes per session: Not on file  . Stress: Not on file   Relationships  . Social Musician on phone: Not on file    Gets together: Not on file    Attends religious service: Not on file    Active member of club or organization: Not on file    Attends meetings of clubs or organizations: Not on file    Relationship status: Not on file  Other Topics Concern  . Not on file  Social History Narrative  . Not on file   Family History  Problem Relation Age of Onset  . Asthma Other        Nephew  . Hypertension Mother   . Other Neg Hx    Past Surgical History:  Procedure Laterality Date  . CESAREAN SECTION    . CESAREAN SECTION  05/06/2012   Procedure: CESAREAN SECTION;  Surgeon: Melanie Nails, MD;  Location: [facility];  Service: Obstetrics;  Laterality: N/AMardella Layman, MD 03/03/19 1025

## 2019-03-03 NOTE — ED Triage Notes (Signed)
Patient presents to Urgent Care with complaints of abscess under her right axilla since 5 days go. Patient reports she has had them in the past, they generally have to be drained. Area is not open or draining at this time.

## 2019-03-03 NOTE — Discharge Instructions (Addendum)
You have had an abscess drained today and you may have had packing placed in the wound to help the abscess continue to drain at home. If packing was placed, please do not remove it. You may shower with the packing in place. Let the soapy water clean your wound. Do not scrub it. Keep your wound covered. Follow up at the Urgent Care in 2 days for a wound check and/or packing removal. Return to the Urgent Care immediately if you develop any of the following symptoms: fever, Increased redness or swelling around where your abscess was, increased pain, or generalized weakness or vomiting.  

## 2020-01-29 ENCOUNTER — Other Ambulatory Visit: Payer: Self-pay

## 2020-01-29 ENCOUNTER — Encounter (HOSPITAL_COMMUNITY): Payer: Self-pay

## 2020-01-29 ENCOUNTER — Ambulatory Visit (HOSPITAL_COMMUNITY)
Admission: EM | Admit: 2020-01-29 | Discharge: 2020-01-29 | Disposition: A | Discharge status: Home / Self Care | Payer: Medicaid Other | Attending: Family Medicine | Admitting: Family Medicine

## 2020-01-29 DIAGNOSIS — L02411 Cutaneous abscess of right axilla: Secondary | ICD-10-CM

## 2020-01-29 MED ORDER — DOXYCYCLINE HYCLATE 100 MG PO CAPS: 100.0000 mg | ORAL_CAPSULE | Freq: Two times a day (BID) | ORAL | 0 refills | Status: DC

## 2020-01-29 MED ORDER — IBUPROFEN 800 MG PO TABS: 800.0000 mg | ORAL_TABLET | Freq: Three times a day (TID) | ORAL | 0 refills | Status: DC | PRN

## 2020-01-29 NOTE — ED Triage Notes (Signed)
Pt in with c/o painful abscess under right arm that she noticed Sunday.  Last took tylenol last night for pain with no relief  Denies any fever or drainage

## 2020-01-29 NOTE — Discharge Instructions (Signed)
Warm compresses to promote further drainage, particularly today.  Change dressing daily, apply pressure for any oozing blood.  Complete course of antibiotics.  Return for any further concerns or worsening of symptoms.

## 2020-01-29 NOTE — ED Provider Notes (Signed)
[facility]    CSN: [id number] Arrival date & time: 01/29/20  1113      History   Chief Complaint Chief Complaint  Patient presents with  . Abscess    HPI Melanie Neal is a 34 y.o. female.   [name] presents with complaints of abscess to right axilla. Noted it first on 10/24, and it is worsening. No fevers. No drainage. Has had similar in the past and has required I&D in the past in the same location.    ROS per HPI, negative if not otherwise mentioned.      Past Medical History:  Diagnosis Date  . Abnormal Pap smear 2009   Was to have f/u;but did not have done;last pap 2009 was not normal  . Abscess   . Anemia    during prev pregnancy;iron supplements taken  . Complication of anesthesia 2009   Anesthesia did not last long;  . Infection    yeast inf; not frequently  . Infection    UTI;not frequently    Patient Active Problem List   Diagnosis Date Noted  . Status post repeat low transverse cesarean section 05/06/2012  . Chlamydia infection, current pregnancy 04/14/2012  . Previous cesarean section 11/20/2011  . Group B streptococcal infection 09/28/2011  . Normal IUP (intrauterine pregnancy) on prenatal ultrasound 09/24/2011  . PAP SMER CERV W/HI GRADE SQUAMOUS INTRAEPITH LES 12/21/2008  . Morbid obesity (HCC) 12/08/2008    Past Surgical History:  Procedure Laterality Date  . CESAREAN SECTION    . CESAREAN SECTION  05/06/2012   Procedure: CESAREAN SECTION;  Surgeon: Purcell Nails, MD;  Location: [facility];  Service: Obstetrics;  Laterality: N/A;    OB History    Gravida  2   Para  2   Term  2   Preterm      AB      Living  2     SAB      TAB      Ectopic      Multiple      Live Births  2            Home Medications    Prior to Admission medications   Medication Sig Start Date End Date Taking? Authorizing Provider  doxycycline (VIBRAMYCIN) 100 MG capsule Take 1 capsule (100 mg total) by mouth 2 (two)  times daily. 01/29/20   Georgetta Haber, NP  ibuprofen (ADVIL) 800 MG tablet Take 1 tablet (800 mg total) by mouth every 8 (eight) hours as needed for mild pain or moderate pain. 01/29/20   Georgetta Haber, NP  traMADol (ULTRAM) 50 MG tablet Take 1 tablet (50 mg total) by mouth every 6 (six) hours as needed. 09/30/17   Georgetta Haber, NP    Family History Family History  Problem Relation Age of Onset  . Asthma Other        Nephew  . Hypertension Mother   . Other Neg Hx     Social History Social History   Tobacco Use  . Smoking status: Never Smoker  . Smokeless tobacco: Never Used  Vaping Use  . Vaping Use: Never used  Substance Use Topics  . Alcohol use: No  . Drug use: Yes    Frequency: 7.0 times per week    Types: Marijuana    Comment: occasionally     Allergies   Vicodin [hydrocodone-acetaminophen]   Review of Systems Review of Systems   Physical Exam Triage Vital  Signs ED Triage Vitals  Enc Vitals Group     BP 01/29/20 1153 122/87     Pulse Rate 01/29/20 1153 85     Resp 01/29/20 1153 19     Temp 01/29/20 1153 98.9 F (37.2 C)     Temp Source 01/29/20 1153 Oral     SpO2 01/29/20 1153 97 %     Weight --      Height --      Head Circumference --      Peak Flow --      Pain Score 01/29/20 1151 10     Pain Loc --      Pain Edu? --      Excl. in GC? --    No data found.  Updated Vital Signs BP 122/87 (BP Location: Right Arm)   Pulse 85   Temp 98.9 F (37.2 C) (Oral)   Resp 19   SpO2 97%   Visual Acuity Right Eye Distance:   Left Eye Distance:   Bilateral Distance:    Right Eye Near:   Left Eye Near:    Bilateral Near:     Physical Exam Constitutional:      General: She is not in acute distress.    Appearance: She is well-developed.  Cardiovascular:     Rate and Rhythm: Normal rate.  Pulmonary:     Effort: Pulmonary effort is normal.  Skin:    General: Skin is warm and dry.     Comments: 1.5 cm fluctuant abscess to rt axilla     Neurological:     Mental Status: She is alert and oriented to person, place, and time.      UC Treatments / Results  Labs (all labs ordered are listed, but only abnormal results are displayed) Labs Reviewed - No data to display  EKG   Radiology No results found.  Procedures Incision and Drainage  Date/Time: 01/29/2020 1:52 PM Performed by: Georgetta Haber, NP Authorized by: Eustace Moore, MD   Consent:    Consent obtained:  Verbal   Consent given by:  Patient   Risks discussed:  Incomplete drainage, pain and infection   Alternatives discussed:  No treatment and referral Location:    Type:  Abscess   Size:  1.5   Location: right axillary  Pre-procedure details:    Skin preparation:  Betadine Anesthesia (see MAR for exact dosages):    Anesthesia method:  Topical application   Topical anesthesia: pain ease freeze spray  Procedure type:    Complexity:  Simple Procedure details:    Incision types:  Stab incision   Scalpel blade:  11   Drainage:  Purulent   Drainage amount:  Copious   Wound treatment:  Wound left open   Packing materials:  None Post-procedure details:    Patient tolerance of procedure:  Tolerated well, no immediate complications   (including critical care time)  Medications Ordered in UC Medications - No data to display  Initial Impression / Assessment and Plan / UC Course  I have reviewed the triage vital signs and the nursing notes.  Pertinent labs & imaging results that were available during my care of the patient were reviewed by me and considered in my medical decision making (see chart for details).     Stab incision to right axillary abscess with antibiotics provided. Wound care discussed. Return precautions provided. Patient verbalized understanding and agreeable to plan.   Final Clinical Impressions(s) / UC Diagnoses   Final  diagnoses:  Abscess of axilla, right     Discharge Instructions     Warm compresses to  promote further drainage, particularly today.  Change dressing daily, apply pressure for any oozing blood.  Complete course of antibiotics.  Return for any further concerns or worsening of symptoms.    ED Prescriptions    Medication Sig Dispense Auth. Provider   doxycycline (VIBRAMYCIN) 100 MG capsule Take 1 capsule (100 mg total) by mouth 2 (two) times daily. 20 capsule Linus Mako B, NP   ibuprofen (ADVIL) 800 MG tablet Take 1 tablet (800 mg total) by mouth every 8 (eight) hours as needed for mild pain or moderate pain. 21 tablet Georgetta Haber, NP     PDMP not reviewed this encounter.   Georgetta Haber, NP 01/29/20 1354

## 2020-06-05 ENCOUNTER — Emergency Department (HOSPITAL_COMMUNITY)
Admission: EM | Admit: 2020-06-05 | Discharge: 2020-06-05 | Disposition: A | Discharge status: Home / Self Care | Payer: Medicaid Other | Attending: Emergency Medicine | Admitting: Emergency Medicine

## 2020-06-05 ENCOUNTER — Emergency Department (HOSPITAL_COMMUNITY): Payer: Medicaid Other

## 2020-06-05 ENCOUNTER — Other Ambulatory Visit: Payer: Self-pay

## 2020-06-05 DIAGNOSIS — S91002A Unspecified open wound, left ankle, initial encounter: Secondary | ICD-10-CM | POA: Insufficient documentation

## 2020-06-05 DIAGNOSIS — R000 Tachycardia, unspecified: Secondary | ICD-10-CM | POA: Diagnosis not present

## 2020-06-05 DIAGNOSIS — W3400XA Accidental discharge from unspecified firearms or gun, initial encounter: Secondary | ICD-10-CM | POA: Insufficient documentation

## 2020-06-05 DIAGNOSIS — Z23 Encounter for immunization: Secondary | ICD-10-CM | POA: Insufficient documentation

## 2020-06-05 DIAGNOSIS — T1490XA Injury, unspecified, initial encounter: Secondary | ICD-10-CM

## 2020-06-05 DIAGNOSIS — S99912A Unspecified injury of left ankle, initial encounter: Secondary | ICD-10-CM | POA: Diagnosis present

## 2020-06-05 MED ORDER — TETANUS-DIPHTH-ACELL PERTUSSIS 5-2.5-18.5 LF-MCG/0.5 IM SUSY
0.5000 mL | PREFILLED_SYRINGE | Freq: Once | INTRAMUSCULAR | Status: AC
  Administered 2020-06-05: 0.5 mL via INTRAMUSCULAR
  Filled 2020-06-05: qty 0.5
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
## 2020-06-05 NOTE — ED Notes (Signed)
Verbal consent obtained from patient to receive Tdap vaccine. Pt educated on Tdap vaccine. Lot number [id number].
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
## 2020-06-05 NOTE — Discharge Instructions (Signed)
Take Tylenol or ibuprofen for management of pain.  Avoid soaking your wound in stagnant or dirty water such as while taking a bath. You can shower normally. Keep the area clean with mild soap and warm water. Do not apply peroxide or alcohol to your wound as this can break down newly forming skin and prolong wound healing. Keep the area bandaged and change the dressing/bandage at least once per day.  Return for new or concerning symptoms.

## 2020-06-05 NOTE — ED Notes (Signed)
Pt ambulated to bathroom with steady gait. NAD. Pt denies pain.

## 2020-06-05 NOTE — ED Notes (Signed)
Wound cleaned and wrap with non-stick gauze and coban.

## 2021-09-19 ENCOUNTER — Ambulatory Visit (HOSPITAL_COMMUNITY)
Admission: EM | Admit: 2021-09-19 | Discharge: 2021-09-19 | Disposition: A | Discharge status: Home / Self Care | Payer: Medicaid Other | Attending: Emergency Medicine | Admitting: Emergency Medicine

## 2021-09-19 ENCOUNTER — Encounter (HOSPITAL_COMMUNITY): Payer: Self-pay | Admitting: Emergency Medicine

## 2021-09-19 ENCOUNTER — Other Ambulatory Visit: Payer: Self-pay

## 2021-09-19 DIAGNOSIS — L02411 Cutaneous abscess of right axilla: Secondary | ICD-10-CM | POA: Diagnosis not present

## 2021-09-19 MED ORDER — TRAMADOL HCL 50 MG PO TABS: 50.0000 mg | ORAL_TABLET | Freq: Four times a day (QID) | ORAL | 0 refills | Status: AC | PRN

## 2021-09-19 MED ORDER — DOXYCYCLINE HYCLATE 100 MG PO CAPS: 100.0000 mg | ORAL_CAPSULE | Freq: Two times a day (BID) | ORAL | 0 refills | Status: AC

## 2021-09-19 NOTE — ED Triage Notes (Signed)
Reports a history of abscess to right upper arm/axilla.  Patient has pain, swelling, redness to underside of right arm at axilla.  Patient noticed pain on Saturday.  Patient has used warm compresses, seems to be getting worse.  Patient did use nair last time

## 2021-09-19 NOTE — ED Provider Notes (Signed)
[facility]    CSN: [id number] Arrival date & time: 09/19/21  1053      History   Chief Complaint Chief Complaint  Patient presents with   Abscess    HPI Melanie Neal is a 36 y.o. female.   Patient presents with abscess to the right underarm beginning 3 days ago.  It has increased in size and become more tender.  Has attempted use of warm compresses which she feels worsened her symptoms.  Denies shaving but did use Darene Lamer for hair removal prior to symptoms beginning.  Denies fever, chills or drainage.  History of reoccurring abscesses to the right axilla.  Past Medical History:  Diagnosis Date   Abnormal Pap smear 2009   Was to have f/u;but did not have done;last pap 2009 was not normal   Abscess    Anemia    during prev pregnancy;iron supplements taken   Complication of anesthesia 2009   Anesthesia did not last long;   Infection    yeast inf; not frequently   Infection    UTI;not frequently    Patient Active Problem List   Diagnosis Date Noted   Status post repeat low transverse cesarean section 05/06/2012   Chlamydia infection, current pregnancy 04/14/2012   Previous cesarean section 11/20/2011   Group B streptococcal infection 09/28/2011   Normal IUP (intrauterine pregnancy) on prenatal ultrasound 09/24/2011   PAP SMER CERV W/HI GRADE SQUAMOUS INTRAEPITH LES 12/21/2008   Morbid obesity (HCC) 12/08/2008    Past Surgical History:  Procedure Laterality Date   CESAREAN SECTION     CESAREAN SECTION  05/06/2012   Procedure: CESAREAN SECTION;  Surgeon: Purcell Nails, MD;  Location: [facility];  Service: Obstetrics;  Laterality: N/A;    OB History     Gravida  2   Para  2   Term  2   Preterm      AB      Living  2      SAB      IAB      Ectopic      Multiple      Live Births  2            Home Medications    Prior to Admission medications   Medication Sig Start Date End Date Taking? Authorizing Provider  doxycycline  (VIBRAMYCIN) 100 MG capsule Take 1 capsule (100 mg total) by mouth 2 (two) times daily. 09/19/21  Yes Carron Jaggi, Elita Boone, NP  traMADol (ULTRAM) 50 MG tablet Take 1 tablet (50 mg total) by mouth every 6 (six) hours as needed. 09/19/21  Yes Laquanta Hummel, Elita Boone, NP    Family History Family History  Problem Relation Age of Onset   Asthma Other        Nephew   Hypertension Mother    Other Neg Hx     Social History Social History   Tobacco Use   Smoking status: Never   Smokeless tobacco: Never  Vaping Use   Vaping Use: Never used  Substance Use Topics   Alcohol use: Yes   Drug use: Yes    Frequency: 7.0 times per week    Types: Marijuana    Comment: occasionally     Allergies   Vicodin [hydrocodone-acetaminophen]   Review of Systems Review of Systems  Constitutional: Negative.   Respiratory: Negative.    Cardiovascular: Negative.   Skin:  Positive for wound. Negative for color change, pallor and rash.  Neurological: Negative.  Physical Exam Triage Vital Signs ED Triage Vitals  Enc Vitals Group     BP 09/19/21 1201 123/85     Pulse Rate 09/19/21 1201 80     Resp 09/19/21 1201 20     Temp 09/19/21 1201 98.3 F (36.8 C)     Temp Source 09/19/21 1201 Oral     SpO2 09/19/21 1201 99 %     Weight --      Height --      Head Circumference --      Peak Flow --      Pain Score 09/19/21 1158 10     Pain Loc --      Pain Edu? --      Excl. in GC? --    No data found.  Updated Vital Signs BP 123/85 (BP Location: Left Arm) Comment (BP Location): large cuff  Pulse 80   Temp 98.3 F (36.8 C) (Oral)   Resp 20   SpO2 99%   Visual Acuity Right Eye Distance:   Left Eye Distance:   Bilateral Distance:    Right Eye Near:   Left Eye Near:    Bilateral Near:     Physical Exam Constitutional:      Appearance: Normal appearance.  HENT:     Head: Normocephalic.  Eyes:     Extraocular Movements: Extraocular movements intact.  Pulmonary:     Effort: Pulmonary  effort is normal.  Skin:    Comments: 2 x 3 firm immature abscess present to the right axilla, tender to palpation, nondraining  Neurological:     Mental Status: She is alert and oriented to person, place, and time. Mental status is at baseline.  Psychiatric:        Mood and Affect: Mood normal.        Behavior: Behavior normal.      UC Treatments / Results  Labs (all labs ordered are listed, but only abnormal results are displayed) Labs Reviewed - No data to display  EKG   Radiology No results found.  Procedures Procedures (including critical care time)  Medications Ordered in UC Medications - No data to display  Initial Impression / Assessment and Plan / UC Course  I have reviewed the triage vital signs and the nursing notes.  Pertinent labs & imaging results that were available during my care of the patient were reviewed by me and considered in my medical decision making (see chart for details).  Abscess of right axilla  Will not attempt incision and drainage due to firmness of the abscess, discussed with patient, doxycycline and tramadol prescribed for outpatient management, advised patient to continue warm compresses to help facilitate drainage, prescribed tramadol for severe pain, PDMP reviewed, low risk, discussed exfoliation to prevent reoccurrence, given strict precautions to follow-up with urgent care for nonhealing nondraining site Final Clinical Impressions(s) / UC Diagnoses   Final diagnoses:  Abscess of axilla, right     Discharge Instructions      Unfortunately your abscess is still firm and if drainage is attempted today we will not be able to expel anything but blood  Take doxycycline twice daily for the next 7 days  Hold warm-hot compresses to affected area at least 4 times a day, this helps to facilitate draining, the more the better  You may use tramadol every 6 hours as needed for severe pain, be mindful this medication may make you  drowsy  You may continue to use Darene Lamer for hair removal, you will  need to exfoliate the area prior to hair removal and watch you begin to see any signs of regrowth, you may use a firm textured washcloth and your normal soap creating circular motions over the area that hair removal occurs  Please return for evaluation for increased swelling, increased tenderness or pain, non healing site, non draining site, you begin to have fever or chills   We reviewed the etiology of recurrent abscesses of skin.  Skin abscesses are collections of pus within the dermis and deeper skin tissues. Skin abscesses manifest as painful, tender, fluctuant, and erythematous nodules, frequently surmounted by a pustule and surrounded by a rim of erythematous swelling.  Spontaneous drainage of purulent material may occur.  Fever can occur on occasion.    -Skin abscesses can develop in healthy individuals with no predisposing conditions other than skin or nasal carriage of Staphylococcus aureus.  Individuals in close contact with others who have active infection with skin abscesses are at increased risk which is likely to explain why twin brother has similar episodes.   In addition, any process leading to a breach in the skin barrier can also predispose to the development of a skin abscesses, such as atopic dermatitis.      ED Prescriptions     Medication Sig Dispense Auth. Provider   doxycycline (VIBRAMYCIN) 100 MG capsule Take 1 capsule (100 mg total) by mouth 2 (two) times daily. 14 capsule Karston Hyland R, NP   traMADol (ULTRAM) 50 MG tablet Take 1 tablet (50 mg total) by mouth every 6 (six) hours as needed. 15 tablet Fedora Knisely, Elita Boone, NP      I have reviewed the PDMP during this encounter.   Valinda Hoar, NP 09/19/21 1226

## 2021-09-19 NOTE — Discharge Instructions (Addendum)
Unfortunately your abscess is still firm and if drainage is attempted today we will not be able to expel anything but blood  Take doxycycline twice daily for the next 7 days  Hold warm-hot compresses to affected area at least 4 times a day, this helps to facilitate draining, the more the better  You may use tramadol every 6 hours as needed for severe pain, be mindful this medication may make you drowsy  You may continue to use Darene Lamer for hair removal, you will need to exfoliate the area prior to hair removal and watch you begin to see any signs of regrowth, you may use a firm textured washcloth and your normal soap creating circular motions over the area that hair removal occurs  Please return for evaluation for increased swelling, increased tenderness or pain, non healing site, non draining site, you begin to have fever or chills   We reviewed the etiology of recurrent abscesses of skin.  Skin abscesses are collections of pus within the dermis and deeper skin tissues. Skin abscesses manifest as painful, tender, fluctuant, and erythematous nodules, frequently surmounted by a pustule and surrounded by a rim of erythematous swelling.  Spontaneous drainage of purulent material may occur.  Fever can occur on occasion.    -Skin abscesses can develop in healthy individuals with no predisposing conditions other than skin or nasal carriage of Staphylococcus aureus.  Individuals in close contact with others who have active infection with skin abscesses are at increased risk which is likely to explain why twin brother has similar episodes.   In addition, any process leading to a breach in the skin barrier can also predispose to the development of a skin abscesses, such as atopic dermatitis.
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
